# Patient Record
Sex: Male | Born: 1990 | Race: White | Hispanic: No | Marital: Single | Smoking: Current every day smoker
Health system: Southern US, Community
[De-identification: ages and names within clinical notes are randomized; demographics above are authoritative.]

## PROBLEM LIST (undated history)

## (undated) DIAGNOSIS — D573 Sickle-cell trait: Secondary | ICD-10-CM

## (undated) DIAGNOSIS — N289 Disorder of kidney and ureter, unspecified: Secondary | ICD-10-CM

---
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
## 2013-08-15 ENCOUNTER — Emergency Department (HOSPITAL_COMMUNITY)
Admission: EM | Admit: 2013-08-15 | Discharge: 2013-08-15 | Disposition: A | Discharge status: Home / Self Care | Payer: Self-pay | Attending: Emergency Medicine | Admitting: Emergency Medicine

## 2013-08-15 ENCOUNTER — Encounter (HOSPITAL_COMMUNITY): Payer: Self-pay | Admitting: Emergency Medicine

## 2013-08-15 DIAGNOSIS — K0490 Unspecified diseases of pulp and periapical tissues: Secondary | ICD-10-CM | POA: Insufficient documentation

## 2013-08-15 DIAGNOSIS — Z791 Long term (current) use of non-steroidal anti-inflammatories (NSAID): Secondary | ICD-10-CM | POA: Insufficient documentation

## 2013-08-15 DIAGNOSIS — F17200 Nicotine dependence, unspecified, uncomplicated: Secondary | ICD-10-CM | POA: Insufficient documentation

## 2013-08-15 MED ORDER — IBUPROFEN 600 MG PO TABS: 600.0000 mg | ORAL_TABLET | Freq: Four times a day (QID) | ORAL | Status: DC | PRN

## 2013-08-15 MED ORDER — HYDROCODONE-ACETAMINOPHEN 5-325 MG PO TABS: 1.0000 | ORAL_TABLET | Freq: Four times a day (QID) | ORAL | Status: DC | PRN

## 2013-08-15 MED ORDER — PENICILLIN V POTASSIUM 500 MG PO TABS: 500.0000 mg | ORAL_TABLET | Freq: Four times a day (QID) | ORAL | Status: AC

## 2013-08-15 MED ORDER — IBUPROFEN 400 MG PO TABS
600.0000 mg | ORAL_TABLET | Freq: Once | ORAL | Status: AC
  Administered 2013-08-15: 600 mg via ORAL
  Filled 2013-08-15 (×2): qty 1

## 2013-08-15 MED ORDER — HYDROCODONE-ACETAMINOPHEN 5-325 MG PO TABS
1.0000 | ORAL_TABLET | Freq: Once | ORAL | Status: AC
  Administered 2013-08-15: 1 via ORAL
  Filled 2013-08-15: qty 1

## 2013-08-15 NOTE — Discharge Instructions (Signed)
Dental Care and Dentist Visits Dental care supports good overall health. Regular dental visits can also help you avoid dental pain, bleeding, infection, and other more serious health problems in the future. It is important to keep the mouth healthy because diseases in the teeth, gums, and other oral tissues can spread to other areas of the body. Some problems, such as diabetes, heart disease, and pre-term labor have been associated with poor oral health.  See your dentist every 6 months. If you experience emergency problems such as a toothache or broken tooth, go to the dentist right away. If you see your dentist regularly, you may catch problems early. It is easier to be treated for problems in the early stages.  WHAT TO EXPECT AT A DENTIST VISIT  Your dentist will look for many common oral health problems and recommend proper treatment. At your regular dental visit, you can expect:  Gentle cleaning of the teeth and gums. This includes scraping and polishing. This helps to remove the sticky substance around the teeth and gums (plaque). Plaque forms in the mouth shortly after eating. Over time, plaque hardens on the teeth as tartar. If tartar is not removed regularly, it can cause problems. Cleaning also helps remove stains.  Periodic X-rays. These pictures of the teeth and supporting bone will help your dentist assess the health of your teeth.  Periodic fluoride treatments. Fluoride is a natural mineral shown to help strengthen teeth. Fluoride treatmentinvolves applying a fluoride gel or varnish to the teeth. It is most commonly done in children.  Examination of the mouth, tongue, jaws, teeth, and gums to look for any oral health problems, such as:  Cavities (dental caries). This is decay on the tooth caused by plaque, sugar, and acid in the mouth. It is best to catch a cavity when it is small.  Inflammation of the gums caused by plaque buildup (gingivitis).  Problems with the mouth or malformed  or misaligned teeth.  Oral cancer or other diseases of the soft tissues or jaws. KEEP YOUR TEETH AND GUMS HEALTHY For healthy teeth and gums, follow these general guidelines as well as your dentist's specific advice:  Have your teeth professionally cleaned at the dentist every 6 months.  Brush twice daily with a fluoride toothpaste.  Floss your teeth daily.  Ask your dentist if you need fluoride supplements, treatments, or fluoride toothpaste.  Eat a healthy diet. Reduce foods and drinks with added sugar.  Avoid smoking. TREATMENT FOR ORAL HEALTH PROBLEMS If you have oral health problems, treatment varies depending on the conditions present in your teeth and gums.  Your caregiver will most likely recommend good oral hygiene at each visit.  For cavities, gingivitis, or other oral health disease, your caregiver will perform a procedure to treat the problem. This is typically done at a separate appointment. Sometimes your caregiver will refer you to another dental specialist for specific tooth problems or for surgery. SEEK IMMEDIATE DENTAL CARE IF:  You have pain, bleeding, or soreness in the gum, tooth, jaw, or mouth area.  A permanent tooth becomes loose or separated from the gum socket.  You experience a blow or injury to the mouth or jaw area. Document Released: 02/25/2011 Document Revised: 09/07/2011 Document Reviewed: 02/25/2011 [employer] Patient Information 2014 [employer].  

## 2013-08-15 NOTE — ED Provider Notes (Signed)
CSN: [id number]     Arrival date & time 08/15/13  0018 History   First MD Initiated Contact with Patient 08/15/13 0031     Chief Complaint  Patient presents with  . Dental Pain     (Consider location/radiation/quality/duration/timing/severity/associated sxs/prior Treatment) HPI Comments: Pt comes in with cc of a tooth ache. Pt has hx of poor dentition, and started having left sided, tooth #17 pain yday. He took 8 aleves - and has minimal relief. Pain worse with eating. No n/v/f/c. Unsure about the cold/hot sensitivity.   Patient is a 23 y.o. male presenting with tooth pain. The history is provided by the patient.  Dental Pain Associated symptoms: no fever and no headaches     History reviewed. No pertinent past medical history. History reviewed. No pertinent past surgical history. No family history on file. History  Substance Use Topics  . Smoking status: Current Every Day Smoker    Types: Cigarettes  . Smokeless tobacco: Current User  . Alcohol Use: No    Review of Systems  Constitutional: Negative for fever and activity change.  HENT: Negative for sore throat.   Gastrointestinal: Negative for nausea and vomiting.  Neurological: Negative for headaches.      Allergies  Fish allergy and Tramadol  Home Medications   Current Outpatient Rx  Name  Route  Sig  Dispense  Refill  . acetaminophen (TYLENOL) 325 MG tablet   Oral   Take 650 mg by mouth every 6 (six) hours as needed for mild pain.         Marland Kitchen. ibuprofen (ADVIL,MOTRIN) 200 MG tablet   Oral   Take 200 mg by mouth every 6 (six) hours as needed for mild pain.         . naproxen sodium (ANAPROX) 220 MG tablet   Oral   Take 220 mg by mouth 2 (two) times daily with a meal.          BP 128/80  Pulse 84  Temp(Src) 97 F (36.1 C) (Oral)  Resp 20  SpO2 97% Physical Exam  Nursing note and vitals reviewed. Constitutional: He appears well-developed.  HENT:  Head: Normocephalic and atraumatic.  Poor  dentition around the molars and premolars. No purulence palpated. No trismus.  Cardiovascular: Normal rate.   Pulmonary/Chest: Breath sounds normal.  Lymphadenopathy:    He has no cervical adenopathy.    ED Course  Procedures (including critical care time) Labs Review Labs Reviewed - No data to display Imaging Review No results found.  EKG Interpretation   None       MDM   Final diagnoses:  None    DDx includes: - Periapical tooth infection - Dental abscess - Gingivitis - Dental trauma - Pulpitis - Nerve root compression  Pt appears to have some periapical dz. Will give antibiotics, and dental referral. No signs of deep infection.  Derwood KaplanAnkit Nautika Cressey, MD 08/15/13 [phone]

## 2013-08-15 NOTE — ED Notes (Signed)
PT reports a HX of dental pain. Pt reports he needs to have teeth pulled

## 2014-11-21 ENCOUNTER — Encounter (HOSPITAL_COMMUNITY): Payer: Self-pay | Admitting: Emergency Medicine

## 2014-11-21 ENCOUNTER — Emergency Department (HOSPITAL_COMMUNITY)
Admission: EM | Admit: 2014-11-21 | Discharge: 2014-11-21 | Disposition: A | Discharge status: Home / Self Care | Payer: Self-pay | Attending: Emergency Medicine | Admitting: Emergency Medicine

## 2014-11-21 DIAGNOSIS — Y998 Other external cause status: Secondary | ICD-10-CM | POA: Insufficient documentation

## 2014-11-21 DIAGNOSIS — T7840XA Allergy, unspecified, initial encounter: Secondary | ICD-10-CM | POA: Insufficient documentation

## 2014-11-21 DIAGNOSIS — Z87891 Personal history of nicotine dependence: Secondary | ICD-10-CM | POA: Insufficient documentation

## 2014-11-21 DIAGNOSIS — Y9289 Other specified places as the place of occurrence of the external cause: Secondary | ICD-10-CM | POA: Insufficient documentation

## 2014-11-21 DIAGNOSIS — Y9389 Activity, other specified: Secondary | ICD-10-CM | POA: Insufficient documentation

## 2014-11-21 DIAGNOSIS — X58XXXA Exposure to other specified factors, initial encounter: Secondary | ICD-10-CM | POA: Insufficient documentation

## 2014-11-21 MED ORDER — EPINEPHRINE 0.3 MG/0.3ML IJ SOAJ: 0.3000 mg | Freq: Once | INTRAMUSCULAR | Status: DC

## 2014-11-21 MED ORDER — FAMOTIDINE IN NACL 20-0.9 MG/50ML-% IV SOLN
20.0000 mg | Freq: Once | INTRAVENOUS | Status: AC
  Administered 2014-11-21: 20 mg via INTRAVENOUS
  Filled 2014-11-21: qty 50

## 2014-11-21 MED ORDER — METHYLPREDNISOLONE SODIUM SUCC 125 MG IJ SOLR
125.0000 mg | Freq: Once | INTRAMUSCULAR | Status: AC
  Administered 2014-11-21: 125 mg via INTRAVENOUS
  Filled 2014-11-21: qty 2

## 2014-11-21 MED ORDER — DIPHENHYDRAMINE HCL 50 MG/ML IJ SOLN
25.0000 mg | Freq: Once | INTRAMUSCULAR | Status: AC
  Administered 2014-11-21: 25 mg via INTRAVENOUS
  Filled 2014-11-21: qty 1

## 2014-11-21 MED ORDER — PREDNISONE 50 MG PO TABS: ORAL_TABLET | ORAL | Status: DC

## 2014-11-21 MED ORDER — SODIUM CHLORIDE 0.9 % IV BOLUS (SEPSIS)
1000.0000 mL | Freq: Once | INTRAVENOUS | Status: AC
  Administered 2014-11-21: 1000 mL via INTRAVENOUS

## 2014-11-21 NOTE — ED Notes (Signed)
Pt alert & oriented x4, stable gait. Patient given discharge instructions, paperwork & prescription(s). Patient  instructed to stop at the registration desk to finish any additional paperwork. Patient verbalized understanding. Pt left department w/ no further questions. 

## 2014-11-21 NOTE — ED Notes (Signed)
Pt states he feels like he is improving. Pt is less red now.

## 2014-11-21 NOTE — Discharge Instructions (Signed)
Prescription for prednisone and EpiPen. Recommend seeing an allergist. Can also take Benadryl.

## 2014-11-21 NOTE — ED Provider Notes (Signed)
CSN: [id number]     Arrival date & time 11/21/14  2035 History  This chart was scribed for Brady Hutching, MD by Modena Jansky, ED Scribe. This patient was seen in room APA07/[facility]07 and the patient's care was started at 8:46 PM.  Chief Complaint  Patient presents with  . Rash   The history is provided by the patient. No language interpreter was used.   HPI Comments:  Level 5 caveat for urgent need for intervention Brady Nunez is a 24 y.o. male who presents to the Emergency Department complaining of allergic reaction that occurred about 30 minutes ago. He reports that he ate some chinese food today when he started having a constant moderate itching rash, and intermittent SOB. He states that he had 4 benadryl PTA with some relief. He reports that he is feeling a little bit better now. He reports that he has a prior hx of similar allergic reactions.   History reviewed. No pertinent past medical history. History reviewed. No pertinent past surgical history. No family history on file. History  Substance Use Topics  . Smoking status: Former Smoker    Types: Cigarettes  . Smokeless tobacco: Current User  . Alcohol Use: Yes    Review of Systems A complete 10 system review of systems was obtained and all systems are negative except as noted in the HPI and PMH.   Allergies  Fish allergy and Tramadol  Home Medications   Prior to Admission medications   Medication Sig Start Date End Date Taking? Authorizing Provider  Pseudoephedrine-Ibuprofen 30-200 MG TABS Take 1-2 tablets by mouth once as needed (for cold and sinus symptoms).   Yes Historical Provider, MD  EPINEPHrine (EPIPEN 2-PAK) 0.3 mg/0.3 mL IJ SOAJ injection Inject 0.3 mLs (0.3 mg total) into the muscle once. 11/21/14   Brady Hutching, MD  HYDROcodone-acetaminophen (NORCO/VICODIN) 5-325 MG per tablet Take 1 tablet by mouth every 6 (six) hours as needed. Patient not taking: Reported on 11/21/2014 08/15/13   Derwood Kaplan, MD  predniSONE  (DELTASONE) 50 MG tablet 1 tablet for 4 days, one half tablet for 4 days 11/21/14   Brady Hutching, MD   BP 117/61 mmHg  Pulse 114  Temp(Src) 98.7 F (37.1 C) (Oral)  Resp 24  Ht  (1.702 m)  Wt 140 lb (63.504 kg)  BMI 21.92 kg/m2  SpO2 95% Physical Exam  Constitutional: He is oriented to person, place, and time. He appears well-developed and well-nourished.  HENT:  Head: Normocephalic and atraumatic.  Eyes: Conjunctivae and EOM are normal. Pupils are equal, round, and reactive to light.  Neck: Normal range of motion. Neck supple.  Cardiovascular: Normal rate and regular rhythm.   Pulmonary/Chest: Effort normal and breath sounds normal.  Abdominal: Soft. Bowel sounds are normal.  Musculoskeletal: Normal range of motion.  Neurological: He is alert and oriented to person, place, and time.  Skin: Skin is warm and dry.  Diffuse erythema and wheals   Psychiatric: He has a normal mood and affect. His behavior is normal.  Nursing note and vitals reviewed.   ED Course  Procedures (including critical care time) DIAGNOSTIC STUDIES: Oxygen Saturation is 95% on RA, normal by my interpretation.    COORDINATION OF CARE: 8:50 PM- Pt advised of plan for treatment which includes IV benadryl, IV pepcid, and IM Solu-Medrol and pt agrees.  Labs Review Labs Reviewed - No data to display  Imaging Review No results found.   EKG Interpretation None      MDM  Final diagnoses:  Allergic reaction, initial encounter   Patient presents with significant allergic reaction to [place] food. Rx IV Solumedrol, IV Benadryl, IV Pepcid. He feels much better after IV medication. Referral to allergist. Discharge medications prednisone and EpiPen.  I personally performed the services described in this documentation, which was scribed in my presence. The recorded information has been reviewed and is accurate.     Brady HutchingBrian Ayelen Sciortino, MD 11/21/14 [phone]

## 2014-11-21 NOTE — ED Notes (Signed)
   11/21/14 2044  Skin Color/Condition  Skin Color/Condition (WDL) X  Skin Color Appropriate for ethnicity  Skin Integrity Intact  pt states he is having a reaction to something. Pt says it has happened before. Pt took 3 or 4 benadryl before arriving in the ER.

## 2014-11-21 NOTE — ED Notes (Signed)
Patient is resting comfortably. 

## 2014-11-21 NOTE — ED Notes (Signed)
Patient presents with rash all over.  States was eating chinese food and began breaking out in rash; states he feels like he's wheezing.

## 2014-12-23 ENCOUNTER — Emergency Department (HOSPITAL_COMMUNITY)
Admission: EM | Admit: 2014-12-23 | Discharge: 2014-12-23 | Disposition: A | Discharge status: Home / Self Care | Payer: Self-pay | Attending: Emergency Medicine | Admitting: Emergency Medicine

## 2014-12-23 ENCOUNTER — Encounter (HOSPITAL_COMMUNITY): Payer: Self-pay

## 2014-12-23 DIAGNOSIS — Z862 Personal history of diseases of the blood and blood-forming organs and certain disorders involving the immune mechanism: Secondary | ICD-10-CM | POA: Insufficient documentation

## 2014-12-23 DIAGNOSIS — N410 Acute prostatitis: Secondary | ICD-10-CM

## 2014-12-23 DIAGNOSIS — R319 Hematuria, unspecified: Secondary | ICD-10-CM

## 2014-12-23 DIAGNOSIS — Z87448 Personal history of other diseases of urinary system: Secondary | ICD-10-CM | POA: Insufficient documentation

## 2014-12-23 DIAGNOSIS — Z720 Tobacco use: Secondary | ICD-10-CM | POA: Insufficient documentation

## 2014-12-23 HISTORY — DX: Disorder of kidney and ureter, unspecified: N28.9

## 2014-12-23 HISTORY — DX: Sickle-cell trait: D57.3

## 2014-12-23 LAB — URINALYSIS, ROUTINE W REFLEX MICROSCOPIC
Bilirubin Urine: NEGATIVE
Glucose, UA: NEGATIVE mg/dL
Ketones, ur: NEGATIVE mg/dL
Leukocytes, UA: NEGATIVE
Nitrite: NEGATIVE
PROTEIN: NEGATIVE mg/dL
Specific Gravity, Urine: 1.01 (ref 1.005–1.030)
UROBILINOGEN UA: 0.2 mg/dL (ref 0.0–1.0)
pH: 6.5 (ref 5.0–8.0)

## 2014-12-23 LAB — URINE MICROSCOPIC-ADD ON

## 2014-12-23 MED ORDER — SULFAMETHOXAZOLE-TRIMETHOPRIM 800-160 MG PO TABS
1.0000 | ORAL_TABLET | Freq: Once | ORAL | Status: AC
  Administered 2014-12-23: 1 via ORAL
  Filled 2014-12-23: qty 1

## 2014-12-23 MED ORDER — SULFAMETHOXAZOLE-TRIMETHOPRIM 800-160 MG PO TABS: 1.0000 | ORAL_TABLET | Freq: Two times a day (BID) | ORAL | Status: AC

## 2014-12-23 NOTE — ED Notes (Signed)
Pt c/o pain across lower back for the past few days and noticed blood in urine today.

## 2014-12-23 NOTE — ED Provider Notes (Signed)
CSN: [id number]     Arrival date & time 12/23/14  1559 History   First MD Initiated Contact with Patient 12/23/14 1632     Chief Complaint  Patient presents with  . Hematuria      HPI  Patient planes of back pain for the last several days and noticeable blood in his urine today. He states it when he went to urinate noticeable bit of blood at the tip of his penis. When he went to urinate states it looked like a limited mucus as well. This happened yesterday as well. He states he had to bear down and urinate yesterday and today. Mild back pain. No sudden or colicky pain no severe pain no fevers or chills nausea or vomiting. States he's only been with one sexual partner "since getting out of prison in December". His never had urethritis before.  Past Medical History  Diagnosis Date  . Renal disorder     kidney stone  . Sickle cell trait    History reviewed. No pertinent past surgical history. No family history on file. History  Substance Use Topics  . Smoking status: Current Every Day Smoker    Types: Cigarettes  . Smokeless tobacco: Current User  . Alcohol Use: Yes     Comment: occ    Review of Systems  Constitutional: Negative for fever, chills, diaphoresis, appetite change and fatigue.  HENT: Negative for mouth sores, sore throat and trouble swallowing.   Eyes: Negative for visual disturbance.  Respiratory: Negative for cough, chest tightness, shortness of breath and wheezing.   Cardiovascular: Negative for chest pain.  Gastrointestinal: Negative for nausea, vomiting, abdominal pain, diarrhea and abdominal distention.  Endocrine: Negative for polydipsia, polyphagia and polyuria.  Genitourinary: Negative for dysuria, frequency and hematuria.  Musculoskeletal: Negative for gait problem.  Skin: Negative for color change, pallor and rash.  Neurological: Negative for dizziness, syncope, light-headedness and headaches.  Hematological: Does not bruise/bleed easily.   Psychiatric/Behavioral: Negative for behavioral problems and confusion.      Allergies  Fish allergy and Tramadol  Home Medications   Prior to Admission medications   Medication Sig Start Date End Date Taking? Authorizing Provider  EPINEPHrine (EPIPEN 2-PAK) 0.3 mg/0.3 mL IJ SOAJ injection Inject 0.3 mLs (0.3 mg total) into the muscle once. 11/21/14   [name], MD  HYDROcodone-acetaminophen (NORCO/VICODIN) 5-325 MG per tablet Take 1 tablet by mouth every 6 (six) hours as needed. Patient not taking: Reported on 11/21/2014 08/15/13   Derwood Kaplan, MD  predniSONE (DELTASONE) 50 MG tablet 1 tablet for 4 days, one half tablet for 4 days 11/21/14   [name], MD  sulfamethoxazole-trimethoprim (BACTRIM DS,SEPTRA DS) 800-160 MG per tablet Take 1 tablet by mouth 2 (two) times daily. 12/23/14 01/13/15  Rolland Porter, MD   BP 108/59 mmHg  Pulse 98  Temp(Src) 98.2 F (36.8 C) (Oral)  Resp 18  Ht  (1.702 m)  Wt 140 lb (63.504 kg)  BMI 21.92 kg/m2  SpO2 97% Physical Exam  Constitutional: He is oriented to person, place, and time. He appears well-developed and well-nourished. No distress.  HENT:  Head: Normocephalic.  Eyes: Conjunctivae are normal. Pupils are equal, round, and reactive to light. No scleral icterus.  Neck: Normal range of motion. Neck supple. No thyromegaly present.  Cardiovascular: Normal rate and regular rhythm.  Exam reveals no gallop and no friction rub.   No murmur heard. Pulmonary/Chest: Effort normal and breath sounds normal. No respiratory distress. He has no wheezes. He has  no rales.  Abdominal: Soft. Bowel sounds are normal. He exhibits no distension. There is no tenderness. There is no rebound.  Genitourinary:  Normal appearance of the penis and scrotum. No discharge noted. No inguinal adenopathy. No abnormal testicular masses. Small varicocele palpable in the left.  Musculoskeletal: Normal range of motion.  Neurological: He is alert and oriented to person,  place, and time.  Skin: Skin is warm and dry. No rash noted.  Psychiatric: He has a normal mood and affect. His behavior is normal.    ED Course  Procedures (including critical care time) Labs Review Labs Reviewed  URINALYSIS, ROUTINE W REFLEX MICROSCOPIC (NOT AT [facility]) - Abnormal; Notable for the following:    APPearance HAZY (*)    Hgb urine dipstick LARGE (*)    All other components within normal limits  URINE MICROSCOPIC-ADD ON - Abnormal; Notable for the following:    Bacteria, UA FEW (*)    All other components within normal limits  GC/CHLAMYDIA PROBE AMP ([facility]) NOT AT [facility]    Imaging Review No results found.   EKG Interpretation None      MDM   Final diagnoses:  Hematuria  Acute prostatitis    Hematuria and pyuria. No bacteria. GC chlamydia pending. Culture pending. Symptoms sound most suggestive of prostatitis. Plan is Bactrim. Await culture and additional testing. Twice a day Bactrim times 21 day prescription given.    Rolland Porter, MD 12/23/14 [phone]

## 2014-12-23 NOTE — Discharge Instructions (Signed)
Your urine is also being tested for other types of infections including sexually transmitted diseases.  If any abnormalities show up on additional testing that need further treatment, you will be contacted.   Prostatitis The prostate gland is about the size and shape of a walnut. It is located just below your bladder. It produces one of the components of semen, which is made up of sperm and the fluids that help nourish and transport it out from the testicles. Prostatitis is inflammation of the prostate gland.  There are four types of prostatitis:  Acute bacterial prostatitis. This is the least common type of prostatitis. It starts quickly and usually is associated with a bladder infection, high fever, and shaking chills. It can occur at any age.  Chronic bacterial prostatitis. This is a persistent bacterial infection in the prostate. It usually develops from repeated acute bacterial prostatitis or acute bacterial prostatitis that was not properly treated. It can occur in men of any age but is most common in middle-aged men whose prostate has begun to enlarge. The symptoms are not as severe as those in acute bacterial prostatitis. Discomfort in the part of your body that is in front of your rectum and below your scrotum (perineum), lower abdomen, or in the head of your penis (glans) may represent your primary discomfort.  Chronic prostatitis (nonbacterial). This is the most common type of prostatitis. It is inflammation of the prostate gland that is not caused by a bacterial infection. The cause is unknown and may be associated with a viral infection or autoimmune disorder.  Prostatodynia (pelvic floor disorder). This is associated with increased muscular tone in the pelvis surrounding the prostate. CAUSES The causes of bacterial prostatitis are bacterial infection. The causes of the other types of prostatitis are unknown.  SYMPTOMS  Symptoms can vary depending upon the type of prostatitis that  exists. There can also be overlap in symptoms. Possible symptoms for each type of prostatitis are listed below. Acute Bacterial Prostatitis  Painful urination.  Fever or chills.  Muscle or joint pains.  Low back pain.  Low abdominal pain.  Inability to empty bladder completely. Chronic Bacterial Prostatitis, Chronic Nonbacterial Prostatitis, and Prostatodynia  Sudden urge to urinate.  Frequent urination.  Difficulty starting urine stream.  Weak urine stream.  Discharge from the urethra.  Dribbling after urination.  Rectal pain.  Pain in the testicles, penis, or tip of the penis.  Pain in the perineum.  Problems with sexual function.  Painful ejaculation.  Bloody semen. DIAGNOSIS  In order to diagnose prostatitis, your health care provider will ask about your symptoms. One or more urine samples will be taken and tested (urinalysis). If the urinalysis result is negative for bacteria, your health care provider may use a finger to feel your prostate (digital rectal exam). This exam helps your health care provider determine if your prostate is swollen and tender. It will also produce a specimen of semen that can be analyzed. TREATMENT  Treatment for prostatitis depends on the cause. If a bacterial infection is the cause, it can be treated with antibiotic medicine. In cases of chronic bacterial prostatitis, the use of antibiotics for up to 1 month or 6 weeks may be necessary. Your health care provider may instruct you to take sitz baths to help relieve pain. A sitz bath is a bath of hot water in which your hips and buttocks are under water. This relaxes the pelvic floor muscles and often helps to relieve the pressure on your prostate.  HOME CARE INSTRUCTIONS   Take all medicines as directed by your health care provider.  Take sitz baths as directed by your health care provider. SEEK MEDICAL CARE IF:   Your symptoms get worse, not better.  You have a fever. SEEK IMMEDIATE  MEDICAL CARE IF:   You have chills.  You feel nauseous or vomit.  You feel lightheaded or faint.  You are unable to urinate.  You have blood or blood clots in your urine. MAKE SURE YOU:  Understand these instructions.  Will watch your condition.  Will get help right away if you are not doing well or get worse. Document Released: 06/12/2000 Document Revised: 06/20/2013 Document Reviewed: 01/02/2013 [facility] Patient Information 2015 [place], [place]. This information is not intended to replace advice given to you by your health care provider. Make sure you discuss any questions you have with your health care provider.

## 2014-12-24 LAB — GC/CHLAMYDIA PROBE AMP (~~LOC~~) NOT AT ARMC
CHLAMYDIA, DNA PROBE: NEGATIVE
Neisseria Gonorrhea: NEGATIVE

## 2015-04-22 ENCOUNTER — Encounter (HOSPITAL_COMMUNITY): Payer: Self-pay | Admitting: *Deleted

## 2015-04-22 ENCOUNTER — Emergency Department (HOSPITAL_COMMUNITY)
Admission: EM | Admit: 2015-04-22 | Discharge: 2015-04-22 | Disposition: A | Discharge status: Home / Self Care | Payer: Self-pay | Attending: Emergency Medicine | Admitting: Emergency Medicine

## 2015-04-22 DIAGNOSIS — Z87442 Personal history of urinary calculi: Secondary | ICD-10-CM | POA: Insufficient documentation

## 2015-04-22 DIAGNOSIS — L500 Allergic urticaria: Secondary | ICD-10-CM | POA: Insufficient documentation

## 2015-04-22 DIAGNOSIS — L509 Urticaria, unspecified: Secondary | ICD-10-CM

## 2015-04-22 DIAGNOSIS — Z862 Personal history of diseases of the blood and blood-forming organs and certain disorders involving the immune mechanism: Secondary | ICD-10-CM | POA: Insufficient documentation

## 2015-04-22 DIAGNOSIS — Z720 Tobacco use: Secondary | ICD-10-CM | POA: Insufficient documentation

## 2015-04-22 MED ORDER — METHYLPREDNISOLONE SODIUM SUCC 125 MG IJ SOLR
125.0000 mg | Freq: Once | INTRAMUSCULAR | Status: DC
  Filled 2015-04-22: qty 2

## 2015-04-22 MED ORDER — FAMOTIDINE IN NACL 20-0.9 MG/50ML-% IV SOLN
20.0000 mg | Freq: Once | INTRAVENOUS | Status: DC
  Filled 2015-04-22: qty 50

## 2015-04-22 MED ORDER — DIPHENHYDRAMINE HCL 50 MG/ML IJ SOLN
50.0000 mg | Freq: Once | INTRAMUSCULAR | Status: DC
  Filled 2015-04-22: qty 1

## 2015-04-22 NOTE — ED Notes (Signed)
Pt c/o rash all over body; pt states he has had a reaction before and is unsure of what caused this time

## 2015-04-22 NOTE — Discharge Instructions (Signed)
You may take Benadryl 50 mg every 8 hours as needed.  Hives Hives are itchy, red, swollen areas of the skin. They can vary in size and location on your body. Hives can come and go for hours or several days (acute hives) or for several weeks (chronic hives). Hives do not spread from person to person (noncontagious). They may get worse with scratching, exercise, and emotional stress. CAUSES   Allergic reaction to food, additives, or drugs.  Infections, including the common cold.  Illness, such as vasculitis, lupus, or thyroid disease.  Exposure to sunlight, heat, or cold.  Exercise.  Stress.  Contact with chemicals. SYMPTOMS   Red or white swollen patches on the skin. The patches may change size, shape, and location quickly and repeatedly.  Itching.  Swelling of the hands, feet, and face. This may occur if hives develop deeper in the skin. DIAGNOSIS  Your caregiver can usually tell what is wrong by performing a physical exam. Skin or blood tests may also be done to determine the cause of your hives. In some cases, the cause cannot be determined. TREATMENT  Mild cases usually get better with medicines such as antihistamines. Severe cases may require an emergency epinephrine injection. If the cause of your hives is known, treatment includes avoiding that trigger.  HOME CARE INSTRUCTIONS   Avoid causes that trigger your hives.  Take antihistamines as directed by your caregiver to reduce the severity of your hives. Non-sedating or low-sedating antihistamines are usually recommended. Do not drive while taking an antihistamine.  Take any other medicines prescribed for itching as directed by your caregiver.  Wear loose-fitting clothing.  Keep all follow-up appointments as directed by your caregiver. SEEK MEDICAL CARE IF:   You have persistent or severe itching that is not relieved with medicine.  You have painful or swollen joints. SEEK IMMEDIATE MEDICAL CARE IF:   You have a  fever.  Your tongue or lips are swollen.  You have trouble breathing or swallowing.  You feel tightness in the throat or chest.  You have abdominal pain. These problems may be the first sign of a life-threatening allergic reaction. Call your local emergency services (911 in [place] MAKE SURE YOU:   Understand these instructions.  Will watch your condition.  Will get help right away if you are not doing well or get worse.   This information is not intended to replace advice given to you by your health care provider. Make sure you discuss any questions you have with your health care provider.   Document Released: 06/15/2005 Document Revised: 06/20/2013 Document Reviewed: 09/08/2011 Elsevier Interactive Patient Education [employer].

## 2015-04-22 NOTE — ED Notes (Signed)
Pt wanted IV out and refused meds ordered for allergic reaction. Dr. Elesa MassedWard notified and at bedside to talk with pt. Pt still does not want medications and wants IV out. States he is "all better now" and that this is what happens to him "everytime". IV removed per pt request.

## 2015-04-22 NOTE — ED Provider Notes (Signed)
TIME SEEN: 1:05 AM  CHIEF COMPLAINT: rash  HPI:  HPI Comments: Brady Nunez is a 24 y.o. Male with a hx of previous kidney stones, multiple prior episodes of urticaria without known cause who presents to the Emergency Department complaining of itching, red, waxing and waning, generalized rash onset 1 hour ago. He states that he has a hx of similar symptoms, several times in the past year, but does not know the cause. He states that sometimes he can take Benadryl to relief but has not taken any today. He denies new foods, soaps, detergents, lotions, or medications. He denies fever, chills, SOB, nausea, vomiting.  No difficulty speaking, breathing, swallowing. No lip or tongue swelling. He has not seen a dermatologist for this problem. He denies taking daily medications. No tick bite. No recent camping, hiking. No lesions on his palms, soles are mucous membranes. Describes the rash is pruritic.  ROS: See HPI Constitutional: no fever  Eyes: no drainage  ENT: no runny nose   Cardiovascular:  no chest pain  Resp: no SOB  GI: no vomiting GU: no dysuria Integumentary: rash  Allergy: hives  Musculoskeletal: no leg swelling  Neurological: no slurred speech ROS otherwise negative  PAST MEDICAL HISTORY/PAST SURGICAL HISTORY:  Past Medical History  Diagnosis Date  . Renal disorder     kidney stone  . Sickle cell trait (HCC)     MEDICATIONS:  Prior to Admission medications   Not on File    ALLERGIES:  Allergies  Allergen Reactions  . Tramadol Hives    SOCIAL HISTORY:  Social History  Substance Use Topics  . Smoking status: Current Every Day Smoker    Types: Cigarettes  . Smokeless tobacco: Current User  . Alcohol Use: Yes     Comment: occ    FAMILY HISTORY: History reviewed. No pertinent family history.  EXAM: BP 121/99 mmHg  Pulse 108  Temp(Src) 97.7 F (36.5 C) (Oral)  Resp 20  Ht  (1.702 m)  Wt 135 lb (61.236 kg)  BMI 21.14 kg/m2  CONSTITUTIONAL: Alert and  oriented and responds appropriately to questions. Well-appearing; well-nourished HEAD: Normocephalic EYES: Conjunctivae clear, PERRL ENT: normal nose; no rhinorrhea; moist mucous membranes; pharynx without lesions noted, no tonsillar hypertrophy or exudate, no uvular deviation, no trismus or drooling, no stridor, normal phonation, no angioedema, no lesions involving the mucous membranes NECK: Supple, no meningismus, no LAD  CARD: Regular and minimally tachycardic; S1 and S2 appreciated; no murmurs, no clicks, no rubs, no gallops RESP: Normal chest excursion without splinting or tachypnea; breath sounds clear and equal bilaterally; no wheezes, no rhonchi, no rales, no hypoxia or respiratory distress, speaking full sentences ABD/GI: Normal bowel sounds; non-distended; soft, non-tender, no rebound, no guarding, no peritoneal signs BACK:  The back appears normal and is non-tender to palpation, there is no CVA tenderness EXT: Normal ROM in all joints; non-tender to palpation; no edema; normal capillary refill; no cyanosis, no calf tenderness or swelling    SKIN: Patient's skin is erythematous with diffuse scattered urticaria to his extremities, torso and face, no petechia or purpura, no desquamation or blisters, no vesicular lesions, no bull's-eye rash or target lesions, no rash on his palms, soles are mucous membranes NEURO: Moves all extremities equally, sensation to light touch intact diffusely, cranial nerves II through XII intact PSYCH: The patient's mood and manner are appropriate. Grooming and personal hygiene are appropriate.  MEDICAL DECISION MAKING: Patient here with urticaria of unknown cause. Has had similar symptoms in  the past. No angioedema, hypoxia, hypotension, respiratory distress. No involvement of the palms, soles or mucous membranes. Will treat with Benadryl, Solu-Medrol and Pepcid. I do not feel at this time he needs epinephrine. We'll monitor him closely.  ED PROGRESS:  1:08  AM-Discussed treatment plan which includes seeing an allergist as an outpatient and IV indications with pt at bedside and pt agreed to plan.   1:40 AM  Pt's urticaria have spontaneously resolved prior to receiving any medication. He states he would like to go home and take Benadryl home as needed. He reports is his happened to him in the past for his rash will resolve either on its own or with Benadryl. He states the reason he came to the emergency department was cut as he did not have been drawn home. Have recommended close outpatient follow-up with his PCP who may refer him to an allergy specialist. Discussed return precautions. Given symptoms have resolved without intervention I do not feel he needs to be on steroids. He verbalizes understanding and is comfortable with this plan.     I personally performed the services described in this documentation, which was scribed in my presence. The recorded information has been reviewed and is accurate.     Layla MawKristen N Doris Gruhn, DO 04/22/15 [phone]

## 2015-05-13 ENCOUNTER — Emergency Department (HOSPITAL_COMMUNITY)
Admission: EM | Admit: 2015-05-13 | Discharge: 2015-05-13 | Disposition: A | Discharge status: Home / Self Care | Payer: Self-pay | Attending: Emergency Medicine | Admitting: Emergency Medicine

## 2015-05-13 ENCOUNTER — Encounter (HOSPITAL_COMMUNITY): Payer: Self-pay | Admitting: *Deleted

## 2015-05-13 DIAGNOSIS — F17210 Nicotine dependence, cigarettes, uncomplicated: Secondary | ICD-10-CM | POA: Insufficient documentation

## 2015-05-13 DIAGNOSIS — Z862 Personal history of diseases of the blood and blood-forming organs and certain disorders involving the immune mechanism: Secondary | ICD-10-CM | POA: Insufficient documentation

## 2015-05-13 DIAGNOSIS — L509 Urticaria, unspecified: Secondary | ICD-10-CM | POA: Insufficient documentation

## 2015-05-13 DIAGNOSIS — Z87442 Personal history of urinary calculi: Secondary | ICD-10-CM | POA: Insufficient documentation

## 2015-05-13 DIAGNOSIS — R0600 Dyspnea, unspecified: Secondary | ICD-10-CM | POA: Insufficient documentation

## 2015-05-13 MED ORDER — FAMOTIDINE 20 MG PO TABS: 20.0000 mg | ORAL_TABLET | Freq: Two times a day (BID) | ORAL | Status: AC

## 2015-05-13 MED ORDER — FAMOTIDINE IN NACL 20-0.9 MG/50ML-% IV SOLN
20.0000 mg | Freq: Once | INTRAVENOUS | Status: AC
  Administered 2015-05-13: 20 mg via INTRAVENOUS
  Filled 2015-05-13: qty 50

## 2015-05-13 MED ORDER — DIPHENHYDRAMINE HCL 25 MG PO TABS: 50.0000 mg | ORAL_TABLET | Freq: Four times a day (QID) | ORAL | Status: AC | PRN

## 2015-05-13 MED ORDER — SODIUM CHLORIDE 0.9 % IV BOLUS (SEPSIS)
1000.0000 mL | Freq: Once | INTRAVENOUS | Status: AC
  Administered 2015-05-13: 1000 mL via INTRAVENOUS

## 2015-05-13 MED ORDER — METHYLPREDNISOLONE SODIUM SUCC 125 MG IJ SOLR
125.0000 mg | Freq: Once | INTRAMUSCULAR | Status: AC
  Administered 2015-05-13: 125 mg via INTRAVENOUS
  Filled 2015-05-13: qty 2

## 2015-05-13 MED ORDER — KETOROLAC TROMETHAMINE 30 MG/ML IJ SOLN
30.0000 mg | Freq: Once | INTRAMUSCULAR | Status: AC
  Administered 2015-05-13: 30 mg via INTRAVENOUS
  Filled 2015-05-13: qty 1

## 2015-05-13 MED ORDER — DIPHENHYDRAMINE HCL 50 MG/ML IJ SOLN
50.0000 mg | Freq: Once | INTRAMUSCULAR | Status: AC
  Administered 2015-05-13: 50 mg via INTRAVENOUS
  Filled 2015-05-13: qty 1

## 2015-05-13 MED ORDER — PREDNISONE 20 MG PO TABS: 60.0000 mg | ORAL_TABLET | Freq: Every day | ORAL | Status: DC

## 2015-05-13 NOTE — ED Notes (Signed)
Pt states he woke w/ a rash, has happened several time in the past. Pt denies any new clothes, foods, medications or detergents. Pt no problem w/ speaking, shows no respiratory distress. Pt states trying hard not to scratch.

## 2015-05-13 NOTE — ED Notes (Addendum)
Pt resting calmly w/ eyes closed. Rise & fall of the chest noted. Bed in low position, side rails up x2. NAD noted at this time.  

## 2015-05-13 NOTE — ED Notes (Signed)
Pt states he woke up from sleep with itching to his face and trouble breathing; pt states the rash is all over his body; pt has red raised bumps all over body

## 2015-05-13 NOTE — ED Provider Notes (Signed)
TIME SEEN: 2:30 AM  CHIEF COMPLAINT: Urticaria  HPI: Pt is a 24 y.o. male with history of kidney stones and multiple prior episodes of urticaria without a known cause who presents emergency department tonight with itching, red, raised rash that started 45 minutes prior to arrival. Has had this many times in the past. Normally he is able to take Benadryl at home but could not find any in his house. Denies any new foods, soaps, lotions, detergents, medications. No fevers. States he feels like his skin is painful when it is tight. Feels like he is having a hard time breathing but no wheezing. No difficulty speaking or swallowing. No lip or tongue swelling. He has not yet seen a dermatologist or primary care physician for this.  ROS: See HPI Constitutional: no fever  Eyes: no drainage  ENT: no runny nose   Cardiovascular:  no chest pain  Resp: no SOB  GI: no vomiting GU: no dysuria Integumentary: no rash  Allergy:  hives  Musculoskeletal: no leg swelling  Neurological: no slurred speech ROS otherwise negative  PAST MEDICAL HISTORY/PAST SURGICAL HISTORY:  Past Medical History  Diagnosis Date  . Renal disorder     kidney stone  . Sickle cell trait (HCC)     MEDICATIONS:  Prior to Admission medications   Not on File    ALLERGIES:  Allergies  Allergen Reactions  . Tramadol Hives    SOCIAL HISTORY:  Social History  Substance Use Topics  . Smoking status: Current Every Day Smoker    Types: Cigarettes  . Smokeless tobacco: Current User  . Alcohol Use: Yes     Comment: occ    FAMILY HISTORY: No family history on file.  EXAM: BP 110/76 mmHg  Pulse 111  Temp(Src) 97.7 F (36.5 C) (Oral)  Resp 22  Ht 5\' 7"  (1.702 m)  Wt 145 lb (65.772 kg)  BMI 22.71 kg/m2  SpO2 95% CONSTITUTIONAL: Alert and oriented and responds appropriately to questions. Well-appearing; well-nourished HEAD: Normocephalic EYES: Conjunctivae clear, PERRL ENT: normal nose; no rhinorrhea; moist mucous  membranes; pharynx without lesions noted, no angioedema, normal phonation, no stridor, no drooling, able to swallow his and secretions without any difficulty NECK: Supple, no meningismus, no LAD  CARD: Regular and tachycardic; S1 and S2 appreciated; no murmurs, no clicks, no rubs, no gallops RESP: Normal chest excursion without splinting or tachypnea; breath sounds clear and equal bilaterally; no wheezes, no rhonchi, no rales, no hypoxia or respiratory distress, speaking full sentences ABD/GI: Normal bowel sounds; non-distended; soft, non-tender, no rebound, no guarding, no peritoneal signs BACK:  The back appears normal and is non-tender to palpation, there is no CVA tenderness EXT: Normal ROM in all joints; non-tender to palpation; no edema; normal capillary refill; no cyanosis, no calf tenderness or swelling    SKIN: Normal color for age and race; warm, diffuse urticaria, no rash involving the palms or soles or mucous membranes, no petechiae or purpura, no desquamation or blisters, no vesicular lesions NEURO: Moves all extremities equally, sensation to light touch intact diffusely, cranial nerves II through XII intact PSYCH: The patient's mood and manner are appropriate. Grooming and personal hygiene are appropriate.  MEDICAL DECISION MAKING: Patient here with diffuse urticaria. Reports he is having difficulty breathing this oxygen saturation is normal in his lungs are completely clear with good aeration. No angioedema. No changes in his voice. He is swallowing his secretions without difficulty. We'll give IV Benadryl, Solu-Medrol, Pepcid and monitor patient. He reports that he  feels like his skin is very painful to touch. We'll give him Toradol for pain control.  ED PROGRESS: 3:40 AM  Pt's hives have completely resolved. He reports feeling much better sleeping comfortably. Reports he can breathe normally. Pain is also gone. He states he feels very tired after receiving IV Benadryl and does not have  anyone to pick him up. We'll allow patient to sleep until he is more awake and can safely drive home. We'll discharge with prescription for steroids, Pepcid. Have advised him to get Benadryl to have at home. Again recommended outpatient follow-up. Discussed return precautions. He verbalizes understanding and is comfortable with this plan.     Layla Maw Shaquil Aldana, DO 05/13/15 [phone]

## 2015-05-13 NOTE — Discharge Instructions (Signed)
Hives Hives are itchy, red, swollen areas of the skin. They can vary in size and location on your body. Hives can come and go for hours or several days (acute hives) or for several weeks (chronic hives). Hives do not spread from person to person (noncontagious). They may get worse with scratching, exercise, and emotional stress. CAUSES   Allergic reaction to food, additives, or drugs.  Infections, including the common cold.  Illness, such as vasculitis, lupus, or thyroid disease.  Exposure to sunlight, heat, or cold.  Exercise.  Stress.  Contact with chemicals. SYMPTOMS   Red or white swollen patches on the skin. The patches may change size, shape, and location quickly and repeatedly.  Itching.  Swelling of the hands, feet, and face. This may occur if hives develop deeper in the skin. DIAGNOSIS  Your caregiver can usually tell what is wrong by performing a physical exam. Skin or blood tests may also be done to determine the cause of your hives. In some cases, the cause cannot be determined. TREATMENT  Mild cases usually get better with medicines such as antihistamines. Severe cases may require an emergency epinephrine injection. If the cause of your hives is known, treatment includes avoiding that trigger.  HOME CARE INSTRUCTIONS   Avoid causes that trigger your hives.  Take antihistamines as directed by your caregiver to reduce the severity of your hives. Non-sedating or low-sedating antihistamines are usually recommended. Do not drive while taking an antihistamine.  Take any other medicines prescribed for itching as directed by your caregiver.  Wear loose-fitting clothing.  Keep all follow-up appointments as directed by your caregiver. SEEK MEDICAL CARE IF:   You have persistent or severe itching that is not relieved with medicine.  You have painful or swollen joints. SEEK IMMEDIATE MEDICAL CARE IF:   You have a fever.  Your tongue or lips are swollen.  You have  trouble breathing or swallowing.  You feel tightness in the throat or chest.  You have abdominal pain. These problems may be the first sign of a life-threatening allergic reaction. Call your local emergency services (911 in [place] MAKE SURE YOU:   Understand these instructions.  Will watch your condition.  Will get help right away if you are not doing well or get worse.   This information is not intended to replace advice given to you by your health care provider. Make sure you discuss any questions you have with your health care provider.   Document Released: 06/15/2005 Document Revised: 06/20/2013 Document Reviewed: 09/08/2011 Elsevier Interactive Patient Education 2016 [employer]  

## 2015-05-13 NOTE — ED Notes (Signed)
Pt alert & oriented x4, stable gait. Patient given discharge instructions, paperwork & prescription(s). Patient  instructed to stop at the registration desk to finish any additional paperwork. Patient verbalized understanding. Pt left department w/ no further questions. 

## 2015-07-25 ENCOUNTER — Encounter (HOSPITAL_COMMUNITY): Payer: Self-pay | Admitting: Emergency Medicine

## 2015-07-25 ENCOUNTER — Emergency Department (HOSPITAL_COMMUNITY)
Admission: EM | Admit: 2015-07-25 | Discharge: 2015-07-26 | Discharge status: Against Medical Advice | Payer: Self-pay | Attending: Emergency Medicine | Admitting: Emergency Medicine

## 2015-07-25 DIAGNOSIS — T7840XA Allergy, unspecified, initial encounter: Secondary | ICD-10-CM | POA: Insufficient documentation

## 2015-07-25 DIAGNOSIS — Y9389 Activity, other specified: Secondary | ICD-10-CM | POA: Insufficient documentation

## 2015-07-25 DIAGNOSIS — Y9289 Other specified places as the place of occurrence of the external cause: Secondary | ICD-10-CM | POA: Insufficient documentation

## 2015-07-25 DIAGNOSIS — X58XXXA Exposure to other specified factors, initial encounter: Secondary | ICD-10-CM | POA: Insufficient documentation

## 2015-07-25 DIAGNOSIS — Y998 Other external cause status: Secondary | ICD-10-CM | POA: Insufficient documentation

## 2015-07-25 NOTE — ED Notes (Signed)
Pt c/o allergic reaction with rash to bilateral arms and trunk of body.

## 2015-07-26 NOTE — ED Notes (Signed)
Unable to locate patient at this time. Room is empty and lights are turned off.

## 2015-07-26 NOTE — ED Notes (Signed)
Unable to locate patient at this time. Room is empty.

## 2016-03-16 ENCOUNTER — Encounter (HOSPITAL_COMMUNITY): Payer: Self-pay | Admitting: Emergency Medicine

## 2016-03-16 ENCOUNTER — Emergency Department (HOSPITAL_COMMUNITY)
Admission: EM | Admit: 2016-03-16 | Discharge: 2016-03-16 | Disposition: A | Discharge status: Home / Self Care | Payer: Self-pay | Attending: Emergency Medicine | Admitting: Emergency Medicine

## 2016-03-16 ENCOUNTER — Emergency Department (HOSPITAL_COMMUNITY): Payer: Self-pay

## 2016-03-16 DIAGNOSIS — M791 Myalgia: Secondary | ICD-10-CM | POA: Insufficient documentation

## 2016-03-16 DIAGNOSIS — J209 Acute bronchitis, unspecified: Secondary | ICD-10-CM | POA: Insufficient documentation

## 2016-03-16 DIAGNOSIS — R51 Headache: Secondary | ICD-10-CM | POA: Insufficient documentation

## 2016-03-16 DIAGNOSIS — Z79899 Other long term (current) drug therapy: Secondary | ICD-10-CM | POA: Insufficient documentation

## 2016-03-16 DIAGNOSIS — Z792 Long term (current) use of antibiotics: Secondary | ICD-10-CM | POA: Insufficient documentation

## 2016-03-16 DIAGNOSIS — F17210 Nicotine dependence, cigarettes, uncomplicated: Secondary | ICD-10-CM | POA: Insufficient documentation

## 2016-03-16 MED ORDER — AMOXICILLIN 500 MG PO CAPS: 500.0000 mg | ORAL_CAPSULE | Freq: Three times a day (TID) | ORAL | 0 refills | Status: AC

## 2016-03-16 MED ORDER — BENZONATATE 100 MG PO CAPS: 200.0000 mg | ORAL_CAPSULE | Freq: Three times a day (TID) | ORAL | 0 refills | Status: AC | PRN

## 2016-03-16 NOTE — ED Triage Notes (Signed)
Pt c/o productive cough, HA, and general malaise x 1 day.

## 2016-03-16 NOTE — ED Provider Notes (Signed)
[facility]-EMERGENCY DEPT Provider Note   CSN: [id number] Arrival date & time: 03/16/16  1050  By signing my name below, I, Soijett Blue, attest that this documentation has been prepared under the direction and in the presence of Burgess Amor, PA-C Electronically Signed: Soijett Blue, ED Scribe. 03/16/16. 12:06 PM.   History   Chief Complaint Chief Complaint  Patient presents with  . Cough    HPI Brady Nunez is a 25 y.o. male with a PMHx of sickle cell trait, who presents to the Emergency Department complaining of productive cough x dark green/brown/black sputum onset 2 days ago worsening yesterday. Pt notes that he has a burning sensation every time he coughs. He states that he is having associated symptoms of nasal congestion, post-nasal drip, sore throat due to cough, chills, and resolved wheezing x last night. He states that he has not tried any medications for the relief of his symptoms. He denies fever, SOB, and any other symptoms. Pt states that he has a 11 year cigarette smoking hx and he smokes up to 1-2 PPD. Pt notes that he has quit smoking in 2015 for 9 months and then he spontaneously picked it back up following cessation. Denies having a PCP.    The history is provided by the patient. No language interpreter was used.    Past Medical History:  Diagnosis Date  . Renal disorder    kidney stone  . Sickle cell trait (HCC)     There are no active problems to display for this patient.   History reviewed. No pertinent surgical history.     Home Medications    Prior to Admission medications   Medication Sig Start Date End Date Taking? Authorizing Provider  amoxicillin (AMOXIL) 500 MG capsule Take 1 capsule (500 mg total) by mouth 3 (three) times daily. 03/16/16 03/26/16  Burgess Amor, PA-C  benzonatate (TESSALON) 100 MG capsule Take 2 capsules (200 mg total) by mouth 3 (three) times daily as needed. 03/16/16   Burgess Amor, PA-C  diphenhydrAMINE (BENADRYL) 25 MG tablet Take 2  tablets (50 mg total) by mouth every 6 (six) hours as needed. 05/13/15   Kristen N Ward, DO  famotidine (PEPCID) 20 MG tablet Take 1 tablet (20 mg total) by mouth 2 (two) times daily. Patient not taking: Reported on 07/25/2015 05/13/15   Kristen N Ward, DO  predniSONE (DELTASONE) 20 MG tablet Take 3 tablets (60 mg total) by mouth daily. Patient not taking: Reported on 07/25/2015 05/13/15   Layla Maw Ward, DO    Family History No family history on file.  Social History Social History  Substance Use Topics  . Smoking status: Current Every Day Smoker    Packs/day: 1.00    Types: Cigarettes  . Smokeless tobacco: Current User  . Alcohol use Yes     Comment: occ     Allergies   Tramadol   Review of Systems Review of Systems  Constitutional: Positive for chills. Negative for fever.  HENT: Positive for postnasal drip and sore throat (due to cough).   Respiratory: Positive for cough (productive, green/brown/black sputum) and wheezing (resolved). Negative for shortness of breath.   Musculoskeletal: Positive for myalgias.  Neurological: Positive for headaches.     Physical Exam Updated Vital Signs BP 126/77 (BP Location: Right Arm)   Pulse 100   Temp 98.7 F (37.1 C) (Oral)   Resp 16   Ht 5\' 7"  (1.702 m)   Wt 65.8 kg   SpO2 100%   BMI  22.71 kg/m   Physical Exam  Constitutional: He appears well-developed and well-nourished.  HENT:  Head: Normocephalic and atraumatic.  Mouth/Throat: Uvula is midline, oropharynx is clear and moist and mucous membranes are normal.  Eyes: Conjunctivae are normal.  Neck: Normal range of motion.  Cardiovascular: Normal rate, regular rhythm, normal heart sounds and intact distal pulses.  Exam reveals no gallop and no friction rub.   No murmur heard. Pulmonary/Chest: Effort normal and breath sounds normal. No respiratory distress. He has no wheezes. He has no rales.  Abdominal: He exhibits no distension.  Musculoskeletal: Normal range of motion.   Neurological: He is alert.  Skin: Skin is warm and dry.  Psychiatric: He has a normal mood and affect.  Nursing note and vitals reviewed.    ED Treatments / Results  DIAGNOSTIC STUDIES: Oxygen Saturation is 95% on RA, adequate by my interpretation.    COORDINATION OF CARE: 12:06 PM Discussed treatment plan with pt at bedside which includes CXR, abx Rx, and pt agreed to plan.    Radiology Dg Chest 2 View  Result Date: 03/16/2016 CLINICAL DATA:  Productive cough with headache and malaise for 1 day. EXAM: CHEST  2 VIEW COMPARISON:  07/26/2009 and 02/24/2010 FINDINGS: The heart size and mediastinal contours are normal. The lungs are clear. There is no pleural effusion or pneumothorax. No acute osseous findings are identified. IMPRESSION: Stable chest.  No active cardiopulmonary process. Electronically Signed   By: Carey BullocksWilliam  Veazey M.D.   On: 03/16/2016 11:38    Procedures Procedures (including critical care time)  Medications Ordered in ED Medications - No data to display   Initial Impression / Assessment and Plan / ED Course  I have reviewed the triage vital signs and the nursing notes.  Pertinent imaging results that were available during my care of the patient were reviewed by me and considered in my medical decision making (see chart for details).  Clinical Course    Imaging reviewed and discussed with pt.  Time spent with smoking cessation/hazards.  He was placed on amoxil, tessalon.  Encouraged f/u prn.  Referral given for pcp.  Final Clinical Impressions(s) / ED Diagnoses   Final diagnoses:  Acute bronchitis, unspecified organism    New Prescriptions Discharge Medication List as of 03/16/2016 12:11 PM    START taking these medications   Details  amoxicillin (AMOXIL) 500 MG capsule Take 1 capsule (500 mg total) by mouth 3 (three) times daily., Starting Mon 03/16/2016, Until Thu 03/26/2016, Print    benzonatate (TESSALON) 100 MG capsule Take 2 capsules (200 mg  total) by mouth 3 (three) times daily as needed., Starting Mon 03/16/2016, Print        I personally performed the services described in this documentation, which was scribed in my presence. The recorded information has been reviewed and is accurate.    Burgess AmorJulie York Valliant, PA-C 03/16/16 1216    Loren Raceravid Yelverton, MD 03/17/16 1451

## 2016-03-16 NOTE — ED Notes (Signed)
Pt to Radiology

## 2016-03-16 NOTE — ED Notes (Signed)
Pt back to room.

## 2017-12-24 IMAGING — DX DG CHEST 2V
2 series · 2 of 2 positions shown · non-contrast
Comparison: 07/26/2009 and 02/24/2010

CLINICAL DATA: Productive cough with headache and malaise for 1
day.

EXAM:
CHEST  2 VIEW

[chest pa]
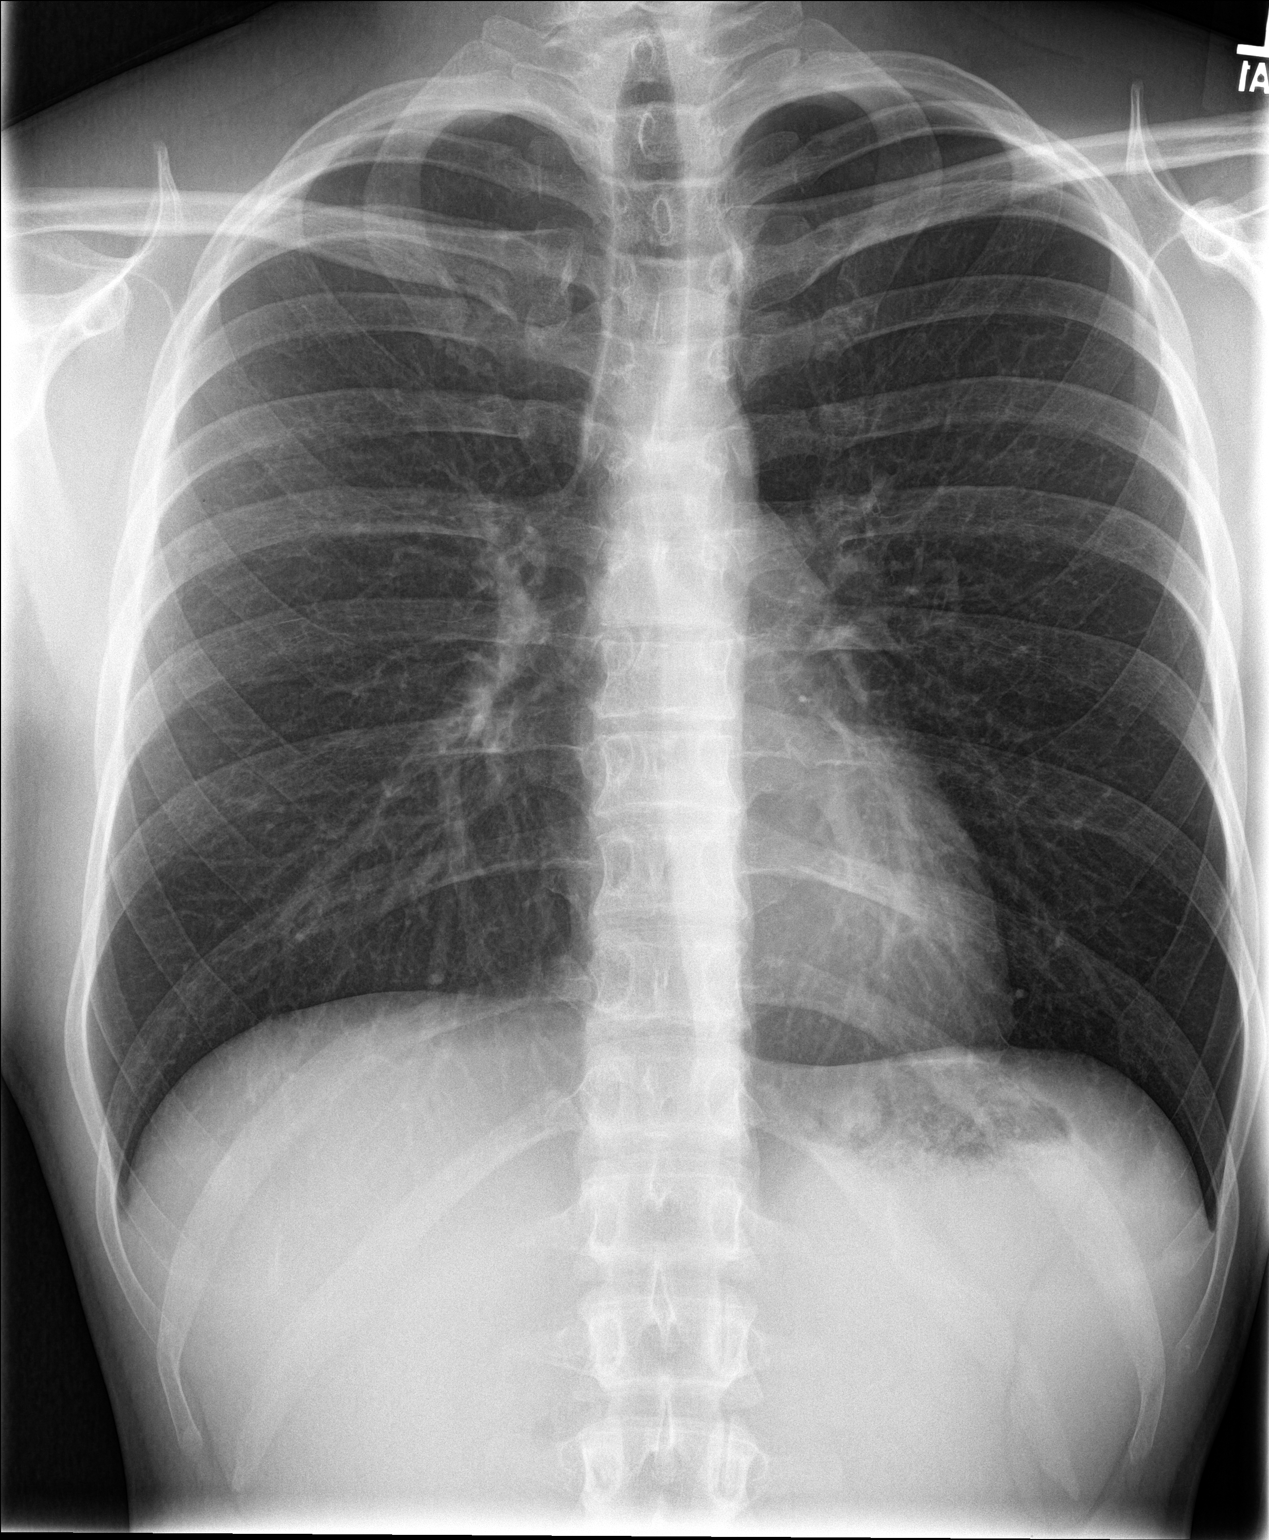

[chest lat]
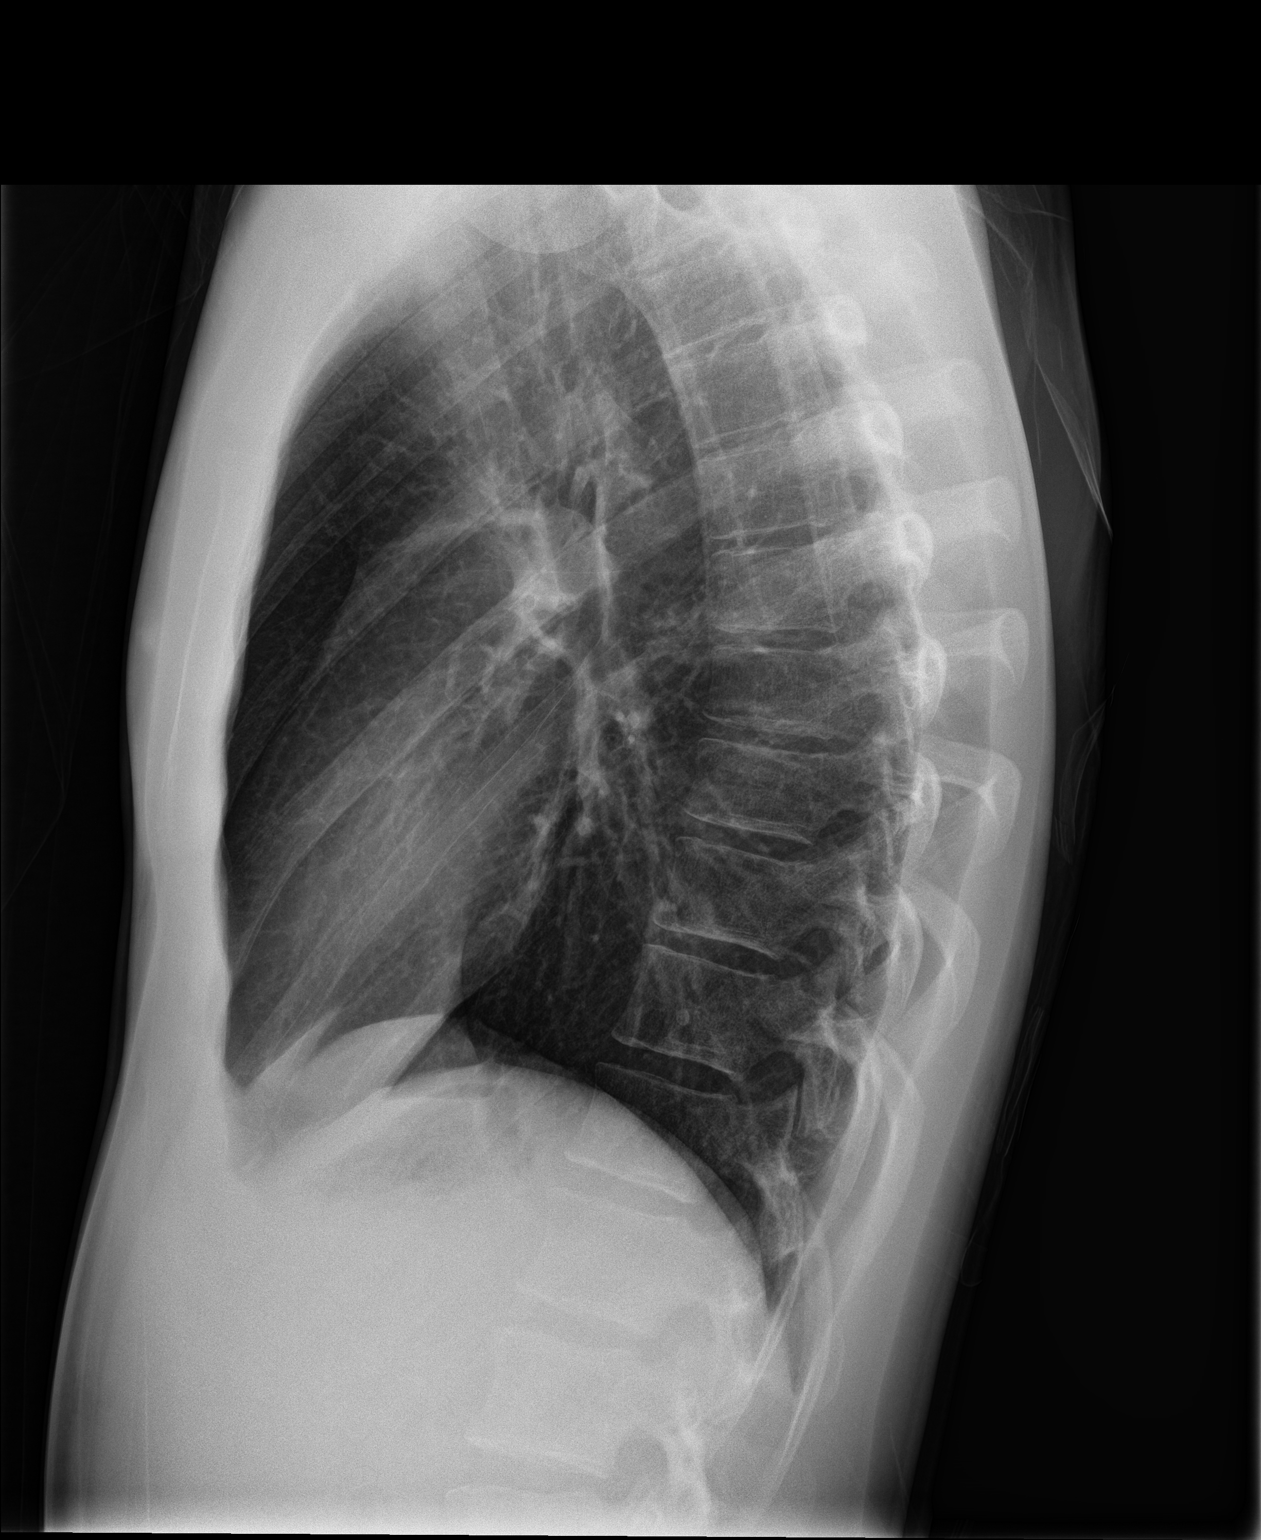

[2 of 2 positions shown; findings below may reference images not displayed]

FINDINGS: The heart size and mediastinal contours are normal. The lungs are
clear. There is no pleural effusion or pneumothorax. No acute
osseous findings are identified.
IMPRESSION: Stable chest.  No active cardiopulmonary process.

## 2021-09-05 ENCOUNTER — Encounter (HOSPITAL_COMMUNITY): Payer: Self-pay | Admitting: Emergency Medicine

## 2021-09-05 ENCOUNTER — Emergency Department (HOSPITAL_COMMUNITY)
Admission: EM | Admit: 2021-09-05 | Discharge: 2021-09-05 | Disposition: A | Discharge status: Home / Self Care | Payer: Self-pay | Attending: Emergency Medicine | Admitting: Emergency Medicine

## 2021-09-05 ENCOUNTER — Other Ambulatory Visit: Payer: Self-pay

## 2021-09-05 DIAGNOSIS — B349 Viral infection, unspecified: Secondary | ICD-10-CM | POA: Insufficient documentation

## 2021-09-05 DIAGNOSIS — Z20822 Contact with and (suspected) exposure to covid-19: Secondary | ICD-10-CM | POA: Insufficient documentation

## 2021-09-05 DIAGNOSIS — F17210 Nicotine dependence, cigarettes, uncomplicated: Secondary | ICD-10-CM | POA: Insufficient documentation

## 2021-09-05 LAB — RESP PANEL BY RT-PCR (FLU A&B, COVID) ARPGX2
Influenza A by PCR: NEGATIVE
Influenza B by PCR: NEGATIVE
SARS Coronavirus 2 by RT PCR: NEGATIVE

## 2021-09-05 NOTE — ED Triage Notes (Signed)
Pt c/o sore throat and hoarse for the past few hours.  ?

## 2021-09-05 NOTE — ED Provider Notes (Signed)
?[facility]-EMERGENCY DEPT ?[facility] Emergency Department ?Provider Note ?MRN:  [id number]  ?Arrival date & time: 09/05/21    ? ?Chief Complaint   ?Sore Throat ?  ?History of Present Illness   ?Brady Nunez is a 31 y.o. year-old male with no pertinent past medical presenting to the ED with chief complaint of sore throat. ? ?Sore throat and hoarse voice over the past 2 hours.  Subjective fever, cough as well. ? ?Review of Systems  ?A thorough review of systems was obtained and all systems are negative except as noted in the HPI and PMH.  ? ?Patient's Health History   ? ?Past Medical History:  ?Diagnosis Date  ? Renal disorder   ? kidney stone  ? Sickle cell trait (HCC)   ?  ?History reviewed. No pertinent surgical history.  ?History reviewed. No pertinent family history.  ?Social History  ? ?Socioeconomic History  ? Marital status: Single  ?  Spouse name: Not on file  ? Number of children: Not on file  ? Years of education: Not on file  ? Highest education level: Not on file  ?Occupational History  ? Not on file  ?Tobacco Use  ? Smoking status: Every Day  ?  Packs/day: 1.00  ?  Types: Cigarettes  ? Smokeless tobacco: Current  ?Substance and Sexual Activity  ? Alcohol use: Yes  ?  Comment: occ  ? Drug use: No  ? Sexual activity: Not on file  ?Other Topics Concern  ? Not on file  ?Social History Narrative  ? Not on file  ? ?Social Determinants of Health  ? ?Financial Resource Strain: Not on file  ?Food Insecurity: Not on file  ?Transportation Needs: Not on file  ?Physical Activity: Not on file  ?Stress: Not on file  ?Social Connections: Not on file  ?Intimate Partner Violence: Not on file  ?  ? ?Physical Exam  ? ?Vitals:  ? 09/05/21 0408  ?BP: 126/80  ?Pulse: 94  ?Resp: 16  ?Temp: 98.3 ?F (36.8 ?C)  ?SpO2: 96%  ?  ?CONSTITUTIONAL: Well-appearing, NAD ?NEURO/PSYCH:  Alert and oriented x 3, no focal deficits ?EYES:  eyes equal and reactive ?ENT/NECK:  no LAD, no JVD ?CARDIO: Regular rate, well-perfused, normal S1 and  S2 ?PULM:  CTAB no wheezing or rhonchi ?GI/GU:  non-distended, non-tender ?MSK/SPINE:  No gross deformities, no edema ?SKIN:  no rash, atraumatic ? ? ?*Additional and/or pertinent findings included in MDM below ? ?Diagnostic and Interventional Summary  ? ? EKG Interpretation ? ?Date/Time:    ?Ventricular Rate:    ?PR Interval:    ?QRS Duration:   ?QT Interval:    ?QTC Calculation:   ?R Axis:     ?Text Interpretation:   ?  ? ?  ? ?Labs Reviewed  ?RESP PANEL BY RT-PCR (FLU A&B, COVID) [id number]  ?  ?No orders to display  ?  ?Medications - No data to display  ? ?Procedures  /  Critical Care ?Procedures ? ?ED Course and Medical Decision Making  ?Initial Impression and Ddx ?Suspect laryngitis in the setting of viral illness.  Posterior oropharynx is mildly erythematous but there is no evidence of abscess or significant contiguous infection.  Doubt emergent process, appropriate for discharge. ? ?Past medical/surgical history that increases complexity of ED encounter: None ? ?Interpretation of Diagnostics ?Not applicable ? ?Patient Reassessment and Ultimate Disposition/Management ?Discharge home ? ?Patient management required discussion with the following services or consulting groups:  None ? ?Complexity of Problems Addressed ?Acute complicated illness  or Injury ? ?Additional Data Reviewed and Analyzed ?Further history obtained from: ?None ? ?Additional Factors Impacting ED Encounter Risk ?None ? ?Elmer Sow. Pilar Plate, MD ?[facility] Emergency Medicine ?[facility] [email or web address] ? ?Final Clinical Impressions(s) / ED Diagnoses  ? ?  ICD-10-CM   ?1. Viral illness  B34.9   ?  ?  ?ED Discharge Orders   ? ? None  ? ?  ?  ? ?Discharge Instructions Discussed with and Provided to Patient:  ? ? ?Discharge Instructions   ? ?  ?You were evaluated in the Emergency Department and after careful evaluation, we did not find any emergent condition requiring admission or further testing in the hospital. ? ?Your exam/testing  today was overall reassuring.  Consistent with viral illness.  Follow-up on your COVID and flu swab results. ? ?Please return to the Emergency Department if you experience any worsening of your condition.  Thank you for allowing [place] to be a part of your care. ? ? ? ? ?  ?Sabas Sous, MD ?09/05/21 [phone] ? ?

## 2021-09-05 NOTE — Discharge Instructions (Signed)
You were evaluated in the Emergency Department and after careful evaluation, we did not find any emergent condition requiring admission or further testing in the hospital. ? ?Your exam/testing today was overall reassuring.  Consistent with viral illness.  Follow-up on your COVID and flu swab results. ? ?Please return to the Emergency Department if you experience any worsening of your condition.  Thank you for allowing [place] to be a part of your care. ? ?

## 2023-02-15 ENCOUNTER — Ambulatory Visit (HOSPITAL_COMMUNITY): Payer: MEDICAID

## 2023-02-19 ENCOUNTER — Ambulatory Visit (HOSPITAL_COMMUNITY): Payer: MEDICAID | Attending: Trauma Surgery

## 2023-02-19 ENCOUNTER — Other Ambulatory Visit: Payer: Self-pay

## 2023-02-19 DIAGNOSIS — R262 Difficulty in walking, not elsewhere classified: Secondary | ICD-10-CM | POA: Diagnosis present

## 2023-02-19 DIAGNOSIS — M79605 Pain in left leg: Secondary | ICD-10-CM | POA: Insufficient documentation

## 2023-02-19 DIAGNOSIS — Z8781 Personal history of (healed) traumatic fracture: Secondary | ICD-10-CM | POA: Diagnosis present

## 2023-02-19 NOTE — Therapy (Signed)
OUTPATIENT PHYSICAL THERAPY EVALUATION (LOWER EXTREMITY)   Patient Name: Brady Nunez MRN: [id number] DOB:10-12-1990, 32 y.o., male Today's Date: 02/19/2023  END OF SESSION:   PT End of Session - 02/19/23 1034     Visit Number 1    Number of Visits 8    Date for PT Re-Evaluation 04/16/23    Authorization Type [employer](limit- no; auth- no)    PT Start Time 1030    PT Stop Time 1110    PT Time Calculation (min) 40 min    Activity Tolerance Patient tolerated treatment well              Past Medical History:  Diagnosis Date   Renal disorder    kidney stone   Sickle cell trait (HCC)    No past surgical history on file. There are no problems to display for this patient.   PCP: Pcp, NoPCP - General   REFERRING PROVIDER: Ernst Bowler, MDRef Provider   REFERRING DIAG: [phone] (ICD-10-CM) - Displaced bicondylar fracture of left tibia, initial encounter for closed fracture   Rationale for Evaluation and Treatment: Rehabilitation  THERAPY DIAG:  Difficulty in walking, not elsewhere classified  Pain in left leg  Status post closed tibia fracture  ONSET DATE: Oct 31, 2022 --------------------------------------------------------------------------------------------- SUBJECTIVE:                                                                                                                                                                                           SUBJECTIVE STATEMENT: Patient was doing a blackflip and fractured left tibia on 10/31/22. Patient was in prison for two weeks. Surgery was done on May 15th, closed reduction. Patient was released from prison. Patient was put into brace; wore brace from June 2024 until Aug 1st, 2024 where MD cleared patient; referred to OPPT  PERTINENT HISTORY:  N/a  PAIN:  Are you having pain? Yes: NPRS scale: 6/10 Pain location: left lef, tibia Pain description: dull, numb Aggravating factors: any movements Relieving  factors: rest, pain medicine   PRECAUTIONS: None  RED FLAGS: None   WEIGHT BEARING RESTRICTIONS: No  FALLS:  Has patient fallen in last 6 months? 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 patient does not feel he can  PLOF: Independent  PATIENT GOALS: To be able to walk pain free  NEXT MD VISIT: TBD --------------------------------------------------------------------------------------------- OBJECTIVE:   DIAGNOSTIC FINDINGS:  Patient was seen in [place]- no imaging on mychart  PATIENT  SURVEYS:  LEFS Lower Extremity Functional Score: 23 / 80 = 28.7 %  SCREENING FOR RED FLAGS: Bowel or bladder incontinence: No Spinal tumors: No Cauda equina syndrome: No Compression fracture: No Abdominal aneurysm: No  COGNITION: Overall cognitive status: Within functional limits for tasks assessed  POSTURE: No Significant postural limitations      FUNCTIONAL TESTS:  2 minute walk test: 253ft pt using  single point cane on left UE; moderate antalgia/pain    GAIT ANALYSIS: Distance walked: 61ft Assistive device utilized: Single point cane Level of assistance: Complete Independence Comments: Patient ambulates into clinic with single point cane left UE; MOD antalgia, increased knee flexion in left knee during stance  SENSATION: Light touch: Impaired along L5 dermatome    LOWER EXTREMITY MMT:    Lower extremity grossly 4/5 on left except; left ankle Eversion/dorsiflexion 3+/5 LOWER EXTREMITY ROM:     Left lower extremity grossly WFL ANKLE/HIP SPECIAL TESTS Not indicated due to post-op status  PALPATION: MOD tenderness to palpation along incision on left tibia  INTEGUMENTARY   Incision C/D/I with no signs of infection   --------------------------------------------------------------------------------------------- TODAY'S TREATMENT:                                                                                                                              DATE:   02/19/23 PT Initial Eval- Low Complexity  Therapeutic exercise x57min -Supine Quad Sets on left, 10x5" -Supine SLR on left with quad set 2x10 -Standing left TKE with right theraband x10   PATIENT EDUCATION:  Education details: assistive device use, pain management, HEP Person educated: Patient Education method: Medical illustrator Education comprehension: verbalized understanding  HOME EXERCISE PROGRAM: -Supine Quad Sets on left, 10x5" -Supine SLR on left with quad set 2x10 -Standing left TKE with right theraband x10 --------------------------------------------------------------------------------------------- ASSESSMENT:  CLINICAL IMPRESSION: Patient is a 32 y.o. y.o. male who was seen today for physical therapy evaluation and treatment for left leg pain, difficulty walking post/op closed tibia fx 10/31/22. Patient presents to PT with the following objective impairments: Abnormal gait, difficulty walking, decreased ROM, decreased strength, and pain. These impairments limit the patient in activities such as carrying, lifting, bending, sitting, standing, squatting, stairs, and locomotion level. These impairments also limit the patient in participation such as meal prep, cleaning, driving, occupation, and yard work. The patient will benefit from PT to address the limitations/impairments listed below to return to their prior level of function in the domains of activity and participation.    PERSONAL FACTORS:  n/a  are also affecting patient's functional outcome.   REHAB POTENTIAL: Excellent  CLINICAL DECISION MAKING: Stable/uncomplicated  EVALUATION COMPLEXITY:  Low  --------------------------------------------------------------------------------------------- GOALS: Goals reviewed with patient? Yes  SHORT TERM GOALS: Target date: 03/19/2023    Patient will be able to complete 300 ft on the Test to improve lower extremity strength to facilitate safe ambulation around home and community  Baseline: Goal status: INITIAL  2.  Patient will  score a >/=35 on the  LEFS  to demonstrate an improvement in ADL completion, stair negotiation, household/community ambulation, and self-care Baseline:  Goal status: INITIAL  3. Patient will be independent with a basic stretching/strengthening HEP  Baseline:  Goal status: INITIAL   LONG TERM GOALS: Target date: 04/16/2023    Patient will be able to complete 400 ft on the Test to improve lower extremity strength to facilitate safe ambulation around Baseline:  Goal status: INITIAL  2.  Patient will score a >/=50 on the  LEFS  to demonstrate an improvement in ADL completion, stair negotiation, household/community ambulation, and self-care Baseline:  Goal status: INITIAL  3.  Patient will be independent with a comprehensive strengthening HEP  Baseline:  Goal status: INITIAL  --------------------------------------------------------------------------------------------- PLAN:  PT FREQUENCY: 2x/week  PT DURATION: 6 weeks  PLANNED INTERVENTIONS: Therapeutic exercises, Therapeutic activity, Neuromuscular re-education, Balance training, Gait training, Patient/Family education, Self Care, and Joint mobilization.  PLAN FOR NEXT SESSION: Progress left Quad strength    Seymour Bars, PT 02/19/2023, 12:56 PM

## 2023-02-24 ENCOUNTER — Ambulatory Visit (HOSPITAL_COMMUNITY): Payer: MEDICAID

## 2023-02-24 ENCOUNTER — Encounter (HOSPITAL_COMMUNITY): Payer: Self-pay

## 2023-02-24 DIAGNOSIS — R262 Difficulty in walking, not elsewhere classified: Secondary | ICD-10-CM | POA: Diagnosis not present

## 2023-02-24 DIAGNOSIS — Z8781 Personal history of (healed) traumatic fracture: Secondary | ICD-10-CM

## 2023-02-24 DIAGNOSIS — M79605 Pain in left leg: Secondary | ICD-10-CM

## 2023-02-24 NOTE — Therapy (Signed)
OUTPATIENT PHYSICAL THERAPY EVALUATION (LOWER EXTREMITY)   Patient Name: Brady Nunez MRN: [id number] DOB:1991-03-25, 32 y.o., male Today's Date: 02/24/2023  END OF SESSION:   PT End of Session - 02/24/23 1444     Visit Number 2    Number of Visits 8    Date for PT Re-Evaluation 04/16/23    Authorization Type [employer](limit- no; auth- no)    PT Start Time 1444    PT Stop Time 1525    PT Time Calculation (min) 41 min    Activity Tolerance Patient tolerated treatment well    Behavior During Therapy WFL for tasks assessed/performed              Past Medical History:  Diagnosis Date   Renal disorder    kidney stone   Sickle cell trait (HCC)    History reviewed. No pertinent surgical history. There are no problems to display for this patient.   PCP: Pcp, NoPCP - General   REFERRING PROVIDER: Ernst Bowler, MDRef Provider   REFERRING DIAG: [phone] (ICD-10-CM) - Displaced bicondylar fracture of left tibia, initial encounter for closed fracture   Rationale for Evaluation and Treatment: Rehabilitation  THERAPY DIAG:  Difficulty in walking, not elsewhere classified  Pain in left leg  Status post closed tibia fracture  ONSET DATE: Oct 31, 2022 --------------------------------------------------------------------------------------------- SUBJECTIVE:                                                                                                                                                                                           SUBJECTIVE STATEMENT: 02/24/23:  Pt arrived without AD, reports changing UE helped a lot and no reports of pain today.  Reports he walked 6 blocks to work today.  Has began HEP without questions and feels they are helpful.    Patient was doing a blackflip and fractured left tibia on 10/31/22. Patient was in prison for two weeks. Surgery was done on May 15th, closed reduction. Patient was released from prison. Patient was put into brace;  wore brace from June 2024 until Aug 1st, 2024 where MD cleared patient; referred to OPPT  PERTINENT HISTORY:  N/a  PAIN:  Are you having pain? Yes: NPRS scale: 6/10 Pain location: left lef, tibia Pain description: dull, numb Aggravating factors: any movements Relieving factors: rest, pain medicine   PRECAUTIONS: None  RED FLAGS: None   WEIGHT BEARING RESTRICTIONS: No  FALLS:  Has patient fallen in last 6 months? No  LIVING ENVIRONMENT: Lives with: lives with their family Lives in: House/apartment Stairs: No Has following equipment at home: Single point cane  OCCUPATION: Cook; on feet  all day. MD cleared pt for work but patient does not feel he can  PLOF: Independent  PATIENT GOALS: To be able to walk pain free  NEXT MD VISIT: TBD --------------------------------------------------------------------------------------------- OBJECTIVE:   DIAGNOSTIC FINDINGS:  Patient was seen in [facility]- no imaging on mychart  PATIENT SURVEYS:  LEFS Lower Extremity Functional Score: 23 / 80 = 28.7 %  SCREENING FOR RED FLAGS: Bowel or bladder incontinence: No Spinal tumors: No Cauda equina syndrome: No Compression fracture: No Abdominal aneurysm: No  COGNITION: Overall cognitive status: Within functional limits for tasks assessed  POSTURE: No Significant postural limitations      FUNCTIONAL TESTS:  2 minute walk test: 245ft pt using  single point cane on left UE; moderate antalgia/pain    GAIT ANALYSIS: Distance walked: 21ft Assistive device utilized: Single point cane Level of assistance: Complete Independence Comments: Patient ambulates into clinic with single point cane left UE; MOD antalgia, increased knee flexion in left knee during stance  SENSATION: Light touch: Impaired along L5 dermatome    LOWER EXTREMITY MMT:     Lower extremity grossly 4/5 on left except; left ankle Eversion/dorsiflexion 3+/5 LOWER EXTREMITY ROM:     Left lower extremity grossly  WFL ANKLE/HIP SPECIAL TESTS Not indicated due to post-op status  PALPATION: MOD tenderness to palpation along incision on left tibia  INTEGUMENTARY   Incision C/D/I with no signs of infection  --------------------------------------------------------------------------------------------- TODAY'S TREATMENT:                                                                                                                              DATE:  02/24/23: Reviewed goals Educated importance of HEP compliance for maximal benefits  Supine:  Quad sets on left 10x5"  SLR with quad set 15  Bridge 15x Sidelying:  Abduction 10x Prone:   Hip extension 15x BLE Seated:  LAQ 4# 2x 10  BTB Ankle dorsiflexion/eversion 15x 5" Standing:  TKE 15x 5" with GTB  Squat 15x  Heel and toe raises on declined and incline   Lateral step up 4 then 6in 15 each  Forward step up 6in 15x no HHA   02/19/23 PT Initial Eval- Low Complexity  Therapeutic exercise x63min -Supine Quad Sets on left, 10x5" -Supine SLR on left with quad set 2x10 -Standing left TKE with right theraband x10   PATIENT EDUCATION:  Education details: assistive device use, pain management, HEP Person educated: Patient Education method: Medical illustrator Education comprehension: verbalized understanding  HOME EXERCISE PROGRAM: -Supine Quad Sets on left, 10x5" -Supine SLR on left with quad set 2x10 -Standing left TKE with right theraband x10  Access Code: [email or web address] URL: [email or web address] Date: 02/24/2023 Prepared by: Becky Sax  Exercises - Supine Quad Set  - 1 x daily - 7 x weekly - 3 sets - 10 reps - Straight Leg Raise  - 1 x daily - 7 x weekly - 3 sets - 10 reps - Standing Terminal Knee Extension  with Resistance  - 1 x daily - 7 x weekly - 3 sets - 10 reps - Squat with Chair Touch  - 1 x daily - 7 x weekly - 3 sets - 10 reps - Heel Toe Raises with Counter Support  - 1 x daily - 7 x weekly - 3  sets - 10 reps - Seated Ankle Eversion with Anchored Resistance  - 1 x daily - 7 x weekly - 3 sets - 10 reps --------------------------------------------------------------------------------------------- ASSESSMENT:  CLINICAL IMPRESSION: 02/24/23:  Reviewed goals and educated importance of HEP compliance.  Pt able to recall and demonstrate appropriate mechanics with current exercise program.  Session focus with quad/proximal as well as functional strengthening.  Pt tolerated sessions and is progressing well.  Pt able to ambulate with improved mechanics and minimal antalgic gait mechanics with no AD.  Added quad and proximal strengthening exercises to HEP with handout and verbalized understanding.    Eval:  Patient is a 32 y.o. y.o. male who was seen today for physical therapy evaluation and treatment for left leg pain, difficulty walking post/op closed tibia fx 10/31/22. Patient presents to PT with the following objective impairments: Abnormal gait, difficulty walking, decreased ROM, decreased strength, and pain. These impairments limit the patient in activities such as carrying, lifting, bending, sitting, standing, squatting, stairs, and locomotion level. These impairments also limit the patient in participation such as meal prep, cleaning, driving, occupation, and yard work. The patient will benefit from PT to address the limitations/impairments listed below to return to their prior level of function in the domains of activity and participation.    PERSONAL FACTORS:  n/a  are also affecting patient's functional outcome.   REHAB POTENTIAL: Excellent  CLINICAL DECISION MAKING: Stable/uncomplicated  EVALUATION COMPLEXITY: Low  --------------------------------------------------------------------------------------------- GOALS: Goals reviewed with patient? Yes  SHORT TERM GOALS: Target date: 03/19/2023    Patient will be able to complete 300 ft on the Test to improve lower extremity strength  to facilitate safe ambulation around home and community  Baseline: Goal status: IN PROGRESS  2.  Patient will score a >/=35 on the  LEFS  to demonstrate an improvement in ADL completion, stair negotiation, household/community ambulation, and self-care Baseline:  Goal status: IN PROGRESS  3. Patient will be independent with a basic stretching/strengthening HEP  Baseline:  Goal status: IN PROGRESS   LONG TERM GOALS: Target date: 04/16/2023    Patient will be able to complete 400 ft on the Test to improve lower extremity strength to facilitate safe ambulation around Baseline:  Goal status: IN PROGRESS  2.  Patient will score a >/=50 on the  LEFS  to demonstrate an improvement in ADL completion, stair negotiation, household/community ambulation, and self-care Baseline:  Goal status: IN PROGRESS  3.  Patient will be independent with a comprehensive strengthening HEP  Baseline:  Goal status: IN PROGRESS  --------------------------------------------------------------------------------------------- PLAN:  PT FREQUENCY: 2x/week  PT DURATION: 6 weeks  PLANNED INTERVENTIONS: Therapeutic exercises, Therapeutic activity, Neuromuscular re-education, Balance training, Gait training, Patient/Family education, Self Care, and Joint mobilization.  PLAN FOR NEXT SESSION: Progress left Quad strength.  Added incline slope treadmill and progress to retrogait with decline slope for quad strengthening.  Begin step down and increase height as able also SLS/vector stance for hip stability.  Becky Sax, LPTA/CLT; CBIS [phone]  Juel Burrow, PTA 02/24/2023, 3:43 PM

## 2023-02-26 ENCOUNTER — Ambulatory Visit (HOSPITAL_COMMUNITY): Payer: MEDICAID

## 2023-02-26 DIAGNOSIS — Z8781 Personal history of (healed) traumatic fracture: Secondary | ICD-10-CM

## 2023-02-26 DIAGNOSIS — R262 Difficulty in walking, not elsewhere classified: Secondary | ICD-10-CM | POA: Diagnosis not present

## 2023-02-26 DIAGNOSIS — M79605 Pain in left leg: Secondary | ICD-10-CM

## 2023-02-26 NOTE — Therapy (Signed)
OUTPATIENT PHYSICAL THERAPY EVALUATION (LOWER EXTREMITY)   Patient Name: Brady Nunez MRN: [id number] DOB:April 22, 1991, 32 y.o., male Today's Date: 02/26/2023  END OF SESSION:   PT End of Session - 02/26/23 0940     Visit Number 3    Number of Visits 8    Date for PT Re-Evaluation 04/16/23    Authorization Type [employer](limit- no; auth- no)    PT Start Time 0945    PT Stop Time 1025    PT Time Calculation (min) 40 min    Activity Tolerance Patient tolerated treatment well    Behavior During Therapy WFL for tasks assessed/performed              Past Medical History:  Diagnosis Date   Renal disorder    kidney stone   Sickle cell trait (HCC)    No past surgical history on file. There are no problems to display for this patient.   PCP: Pcp, NoPCP - General   REFERRING PROVIDER: Ernst Bowler, MDRef Provider   REFERRING DIAG: [phone] (ICD-10-CM) - Displaced bicondylar fracture of left tibia, initial encounter for closed fracture   Rationale for Evaluation and Treatment: Rehabilitation  THERAPY DIAG:  No diagnosis found.  ONSET DATE: Oct 31, 2022 --------------------------------------------------------------------------------------------- SUBJECTIVE:                                                                                                                                                                                           SUBJECTIVE STATEMENT: 02/24/23:  Patient reports returning back to work for 3 hour shift; able to tolerate with no pain    IE: Patient was doing a backflip and fractured left tibia on 10/31/22. Patient was in prison for two weeks. Surgery was done on May 15th, closed reduction. Patient was released from prison. Patient was put into brace; wore brace from June 2024 until Aug 1st, 2024 where MD cleared patient; referred to OPPT  PERTINENT HISTORY:  N/a  PAIN:  Are you having pain? Yes: NPRS scale: 6/10 Pain location: left lef,  tibia Pain description: dull, numb Aggravating factors: any movements Relieving factors: rest, pain medicine   PRECAUTIONS: None  RED FLAGS: None   WEIGHT BEARING RESTRICTIONS: No  FALLS:  Has patient fallen in last 6 months? 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 patient does not feel he can  PLOF: Independent  PATIENT GOALS: To be able to walk pain free  NEXT MD VISIT: TBD ---------------------------------------------------------------------------------------------  OBJECTIVE:   DIAGNOSTIC FINDINGS:  Patient was seen in [facility]- no imaging on mychart  PATIENT SURVEYS:  LEFS Lower Extremity Functional Score: 23 / 80 = 28.7 %  SCREENING FOR RED FLAGS: Bowel or bladder incontinence: No Spinal tumors: No Cauda equina syndrome: No Compression fracture: No Abdominal aneurysm: No  COGNITION: Overall cognitive status: Within functional limits for tasks assessed  POSTURE: No Significant postural limitations      FUNCTIONAL TESTS:  2 minute walk test: 271ft pt using  single point cane on left UE; moderate antalgia/pain    GAIT ANALYSIS: Distance walked: 80ft Assistive device utilized: Single point cane Level of assistance: Complete Independence Comments: Patient ambulates into clinic with single point cane left UE; MOD antalgia, increased knee flexion in left knee during stance  SENSATION: Light touch: Impaired along L5 dermatome    LOWER EXTREMITY MMT:     Lower extremity grossly 4/5 on left except; left ankle Eversion/dorsiflexion 3+/5 LOWER EXTREMITY ROM:     Left lower extremity grossly WFL ANKLE/HIP SPECIAL TESTS Not indicated due to post-op status  PALPATION: MOD tenderness to palpation along incision on left tibia  INTEGUMENTARY   Incision C/D/I with no signs of  infection  --------------------------------------------------------------------------------------------- TODAY'S TREATMENT:                                                                                                                              DATE:   02/26/23 Manual therapy x83min  -mobilization with movement on left lower extremity for dorsiflexion ROM -AP glides Gr 4-5 left ankle Neuromuscular re-education x68min -Supine D2 Ankle dorsiflexion/ PF with right theraband for ankle stability -step downs on 6" step with contralateral UE assist  -seated leg extension machine 2x10 with 20#    02/24/23: Reviewed goals Educated importance of HEP compliance for maximal benefits  Supine:  Quad sets on left 10x5"  SLR with quad set 15  Bridge 15x Sidelying:  Abduction 10x Prone:   Hip extension 15x BLE Seated:  LAQ 4# 2x 10  BTB Ankle dorsiflexion/eversion 15x 5" Standing:  TKE 15x 5" with GTB  Squat 15x  Heel and toe raises on declined and incline   Lateral step up 4 then 6in 15 each  Forward step up 6in 15x no HHA   PATIENT EDUCATION:  Education details: assistive device use, pain management, HEP Person educated: Patient Education method: Medical illustrator Education comprehension: verbalized understanding  HOME EXERCISE PROGRAM: Access Code: [email or web address] URL: [email or web address] Date: 02/26/2023 Prepared by: Seymour Bars  Exercises - Straight Leg Raise  - 1 x daily - 20 reps - Long Sitting Ankle PNF D2 Plantarflexion with Resistance  - 1 x daily - 20 reps - Long Sitting Ankle PNF D2 Dorsiflexion with Resistance  - 1 x daily - 20 reps - Heel Toe Raises with Counter Support  - 20 reps - Squat with Chair Touch  - 20 reps - Forward Step Down  - 1 x daily -  20 reps --------------------------------------------------------------------------------------------- ASSESSMENT:  CLINICAL IMPRESSION: Today:  Reviewed goals and educated importance of  HEP compliance. PT investigated left ankle active range of motion into dorsiflexion; left ankle lacking 25% active range of motion versus right lower extremity. Session focused on improving left ankle active range of motion followed by updating HEP to include left ankle stability. Pt verbalized understanding of updated HEP.    Eval:  Patient is a 32 y.o. y.o. male who was seen today for physical therapy evaluation and treatment for left leg pain, difficulty walking post/op closed tibia fx 10/31/22. Patient presents to PT with the following objective impairments: Abnormal gait, difficulty walking, decreased ROM, decreased strength, and pain. These impairments limit the patient in activities such as carrying, lifting, bending, sitting, standing, squatting, stairs, and locomotion level. These impairments also limit the patient in participation such as meal prep, cleaning, driving, occupation, and yard work. The patient will benefit from PT to address the limitations/impairments listed below to return to their prior level of function in the domains of activity and participation.    PERSONAL FACTORS:  n/a  are also affecting patient's functional outcome.   REHAB POTENTIAL: Excellent  CLINICAL DECISION MAKING: Stable/uncomplicated  EVALUATION COMPLEXITY: Low  --------------------------------------------------------------------------------------------- GOALS: Goals reviewed with patient? Yes  SHORT TERM GOALS: Target date: 03/19/2023    Patient will be able to complete 300 ft on the Test to improve lower extremity strength to facilitate safe ambulation around home and community  Baseline: Goal status: IN PROGRESS  2.  Patient will score a >/=35 on the  LEFS  to demonstrate an improvement in ADL completion, stair negotiation, household/community ambulation, and self-care Baseline:  Goal status: IN PROGRESS  3. Patient will be independent with a basic stretching/strengthening HEP  Baseline:   Goal status: IN PROGRESS   LONG TERM GOALS: Target date: 04/16/2023    Patient will be able to complete 400 ft on the Test to improve lower extremity strength to facilitate safe ambulation around Baseline:  Goal status: IN PROGRESS  2.  Patient will score a >/=50 on the  LEFS  to demonstrate an improvement in ADL completion, stair negotiation, household/community ambulation, and self-care Baseline:  Goal status: IN PROGRESS  3.  Patient will be independent with a comprehensive strengthening HEP  Baseline:  Goal status: IN PROGRESS  --------------------------------------------------------------------------------------------- PLAN:  PT FREQUENCY: 2x/week  PT DURATION: 6 weeks  PLANNED INTERVENTIONS: Therapeutic exercises, Therapeutic activity, Neuromuscular re-education, Balance training, Gait training, Patient/Family education, Self Care, and Joint mobilization.  PLAN FOR NEXT SESSION: Progress left Quad strength.  Added incline slope treadmill and progress to retrogait with decline slope for quad strengthening.  Begin step down and increase height as able also SLS/vector stance for hip stability.  Becky Sax, LPTA/CLT; CBIS [phone]  Seymour Bars, PT 02/26/2023, 9:41 AM

## 2023-03-03 ENCOUNTER — Emergency Department (HOSPITAL_COMMUNITY)
Admission: EM | Admit: 2023-03-03 | Discharge: 2023-03-03 | Disposition: A | Discharge status: Home / Self Care | Payer: MEDICAID | Attending: Emergency Medicine | Admitting: Emergency Medicine

## 2023-03-03 ENCOUNTER — Other Ambulatory Visit: Payer: Self-pay

## 2023-03-03 ENCOUNTER — Encounter (HOSPITAL_COMMUNITY): Payer: Self-pay

## 2023-03-03 DIAGNOSIS — T63441A Toxic effect of venom of bees, accidental (unintentional), initial encounter: Secondary | ICD-10-CM | POA: Diagnosis present

## 2023-03-03 MED ORDER — DIPHENHYDRAMINE HCL 50 MG/ML IJ SOLN
25.0000 mg | Freq: Once | INTRAMUSCULAR | Status: AC
  Administered 2023-03-03: 25 mg via INTRAVENOUS
  Filled 2023-03-03: qty 1

## 2023-03-03 MED ORDER — PREDNISONE 20 MG PO TABS
40.0000 mg | ORAL_TABLET | Freq: Every day | ORAL | 0 refills | Status: AC
Start: 2023-03-03 — End: ?

## 2023-03-03 MED ORDER — DEXAMETHASONE SODIUM PHOSPHATE 10 MG/ML IJ SOLN
10.0000 mg | Freq: Once | INTRAMUSCULAR | Status: AC
  Administered 2023-03-03: 10 mg via INTRAVENOUS
  Filled 2023-03-03: qty 1

## 2023-03-03 MED ORDER — FAMOTIDINE IN NACL 20-0.9 MG/50ML-% IV SOLN
20.0000 mg | Freq: Once | INTRAVENOUS | Status: AC
  Administered 2023-03-03: 20 mg via INTRAVENOUS
  Filled 2023-03-03: qty 50

## 2023-03-03 NOTE — ED Provider Notes (Signed)
[place] EMERGENCY DEPARTMENT AT [facility] Provider Note   CSN: [id number] Arrival date & time: 03/03/23  1440     History  Chief Complaint  Patient presents with   Insect Bite    JAVONTEZ BACANI is a 32 y.o. male.  HPI     CARLITO SENSAT is a 32 y.o. male who presents to the Emergency Department complaining of pain redness and swelling of his left face since earlier today.  States around 2:30 PM he was stung over his left eye by a hornet.  Notes history of hives with prior bee stings.  No history of anaphylaxis.  He has had gradually worsening pain of his face with swelling of his cheek and left eyelids.  Denies any shortness of breath, chest pain, nausea, vomiting, swelling of his lips or tongue, or difficulty swallowing.  No treatments prior to arrival  Home Medications Prior to Admission medications   Medication Sig Start Date End Date Taking? Authorizing Provider  benzonatate (TESSALON) 100 MG capsule Take 2 capsules (200 mg total) by mouth 3 (three) times daily as needed. 03/16/16   Burgess Amor, PA-C  diphenhydrAMINE (BENADRYL) 25 MG tablet Take 2 tablets (50 mg total) by mouth every 6 (six) hours as needed. 05/13/15   Ward, Layla Maw, DO  famotidine (PEPCID) 20 MG tablet Take 1 tablet (20 mg total) by mouth 2 (two) times daily. Patient not taking: Reported on 07/25/2015 05/13/15   Ward, Layla Maw, DO  predniSONE (DELTASONE) 20 MG tablet Take 3 tablets (60 mg total) by mouth daily. Patient not taking: Reported on 07/25/2015 05/13/15   Ward, Layla Maw, DO      Allergies    Tramadol    Review of Systems   Review of Systems  Constitutional:  Negative for chills and fever.  HENT:  Positive for facial swelling. Negative for sore throat, trouble swallowing and voice change.   Eyes:  Positive for pain.       Bee sting left eyebrow area  Respiratory:  Negative for cough, chest tightness and shortness of breath.   Cardiovascular:  Negative for chest pain.   Gastrointestinal:  Negative for abdominal pain, nausea and vomiting.  Skin:  Positive for color change.  Neurological:  Positive for headaches. Negative for dizziness, syncope and weakness.  Psychiatric/Behavioral:  Negative for confusion.     Physical Exam Updated Vital Signs BP 134/80 (BP Location: Right Arm)   Pulse 94   Temp 99 F (37.2 C) (Oral)   Resp 17   Ht 5\' 7"  (1.702 m)   Wt 65.8 kg   SpO2 100%   BMI 22.71 kg/m  Physical Exam Vitals and nursing note reviewed.  Constitutional:      General: He is not in acute distress.    Appearance: Normal appearance. He is not ill-appearing or toxic-appearing.  HENT:     Head:     Comments: Erythema and edema of the left face including the left periorbital region, left cheek    Mouth/Throat:     Mouth: Mucous membranes are moist.     Pharynx: Oropharynx is clear.     Comments: No edema of the oropharynx lips or tongue.  Uvula midline nonedematous Eyes:     Extraocular Movements: Extraocular movements intact.     Conjunctiva/sclera: Conjunctivae normal.     Pupils: Pupils are equal, round, and reactive to light.     Comments: Edema of the left upper eyelid  Cardiovascular:     Rate  and Rhythm: Normal rate and regular rhythm.     Pulses: Normal pulses.  Pulmonary:     Effort: Pulmonary effort is normal. No respiratory distress.     Breath sounds: No wheezing.  Chest:     Chest wall: No tenderness.  Abdominal:     Palpations: Abdomen is soft.     Tenderness: There is no abdominal tenderness.  Musculoskeletal:        General: Normal range of motion.  Skin:    General: Skin is warm.     Capillary Refill: Capillary refill takes less than 2 seconds.     Findings: Erythema present. No bruising.  Neurological:     General: No focal deficit present.     Mental Status: He is alert.     Sensory: No sensory deficit.     Motor: No weakness.     ED Results / Procedures / Treatments   Labs (all labs ordered are listed, but  only abnormal results are displayed) Labs Reviewed - No data to display  EKG None  Radiology No results found.  Procedures Procedures    Medications Ordered in ED Medications  diphenhydrAMINE (BENADRYL) injection 25 mg (has no administration in time range)  dexamethasone (DECADRON) injection 10 mg (has no administration in time range)  famotidine (PEPCID) IVPB 20 mg premix (has no administration in time range)    ED Course/ Medical Decision Making/ A&P                                 Medical Decision Making Patient here for evaluation of bee sting to his left eyebrow area just before ER arrival.  Stung by a hornet.  No history of anaphylactic reactions but notes prior hives with bee stings.  Has not required epinephrine previously.  Denies any chest pain, shortness of breath difficulty swallowing nausea vomiting or chest pain.  Symptoms currently localized to the left face airways patent.  Differential would include localized reaction versus anaphylaxis.  Will treat patient and observe  Amount and/or Complexity of Data Reviewed Discussion of management or test interpretation with external provider(s): Pt with localized reaction to bee sting.  Observed w/o complication.  No indication for need for epinephrine.    Pt treated with ice, steroid, Pepcid, benadryl.  On recheck, edema and erythema have improved.  No worsening symptoms after lengthy observation in the dept.  Pt reports feeling better, hungry and ready to be discharged.  Tolerating fluids well. I feel his is appropriate for d/c at this time, has benadryl at home rx written for additional steroid.  Strict return precautions discussed  Risk Prescription drug management.           Final Clinical Impression(s) / ED Diagnoses Final diagnoses:  Bee sting, accidental or unintentional, initial encounter    Rx / DC Orders ED Discharge Orders     None         Pauline Aus, PA-C 03/04/23 1156    Terrilee Files, MD 03/05/23 [phone]

## 2023-03-03 NOTE — Discharge Instructions (Signed)
You may continue to use the ice packs on and off to help with swelling.  Take over-the-counter Benadryl 1 capsule every 4-6 hours for the next 2 to 3 days.  Start the prednisone prescription tomorrow.  Return to the emergency department for any new or worsening symptoms.

## 2023-03-03 NOTE — ED Triage Notes (Addendum)
C/o bee sting to left eye PTA Swelling and redness noted on arrival.  Pt reports blurry vision to left eye

## 2023-03-05 ENCOUNTER — Ambulatory Visit (HOSPITAL_COMMUNITY): Payer: MEDICAID | Attending: Trauma Surgery

## 2023-03-05 DIAGNOSIS — Z8781 Personal history of (healed) traumatic fracture: Secondary | ICD-10-CM | POA: Diagnosis present

## 2023-03-05 DIAGNOSIS — M79605 Pain in left leg: Secondary | ICD-10-CM | POA: Diagnosis present

## 2023-03-05 DIAGNOSIS — R262 Difficulty in walking, not elsewhere classified: Secondary | ICD-10-CM | POA: Diagnosis present

## 2023-03-05 NOTE — Therapy (Signed)
OUTPATIENT PHYSICAL THERAPY EVALUATION (LOWER EXTREMITY)   Patient Name: Brady Nunez MRN: [id number] DOB:08-Jul-1990, 32 y.o., male Today's Date: 03/05/2023  END OF SESSION:   PT End of Session - 03/05/23 0904     Visit Number 4    Number of Visits 8    Date for PT Re-Evaluation 04/16/23    Authorization Type [employer](limit- no; auth- no)    PT Start Time 0900    PT Stop Time 0940    PT Time Calculation (min) 40 min    Activity Tolerance Patient tolerated treatment well    Behavior During Therapy WFL for tasks assessed/performed               Past Medical History:  Diagnosis Date   Renal disorder    kidney stone   Sickle cell trait (HCC)    No past surgical history on file. There are no problems to display for this patient.   PCP: Pcp, NoPCP - General   REFERRING PROVIDER: Ernst Bowler, MDRef Provider   REFERRING DIAG: [phone] (ICD-10-CM) - Displaced bicondylar fracture of left tibia, initial encounter for closed fracture   Rationale for Evaluation and Treatment: Rehabilitation  THERAPY DIAG:  Difficulty in walking, not elsewhere classified  Pain in left leg  Status post closed tibia fracture  ONSET DATE: Oct 31, 2022 --------------------------------------------------------------------------------------------- SUBJECTIVE:                                                                                                                                                                                           SUBJECTIVE STATEMENT: Today:  Patient reports have been back to work at about 12-20 hrs. Pt was in ER for bee sting to face 2 days ago; is better now   IE: Patient was doing a backflip and fractured left tibia on 10/31/22. Patient was in prison for two weeks. Surgery was done on May 15th, closed reduction. Patient was released from prison. Patient was put into brace; wore brace from June 2024 until Aug 1st, 2024 where MD cleared patient; referred to  OPPT  PERTINENT HISTORY:  N/a  PAIN:  Are you having pain? Yes: NPRS scale: 6/10 Pain location: left lef, tibia Pain description: dull, numb Aggravating factors: any movements Relieving factors: rest, pain medicine   PRECAUTIONS: None  RED FLAGS: None   WEIGHT BEARING RESTRICTIONS: No  FALLS:  Has patient fallen in last 6 months? 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 patient  does not feel he can  PLOF: Independent  PATIENT GOALS: To be able to walk pain free  NEXT MD VISIT: TBD --------------------------------------------------------------------------------------------- OBJECTIVE:   DIAGNOSTIC FINDINGS:  Patient was seen in [facility]- no imaging on mychart  PATIENT SURVEYS:  LEFS Lower Extremity Functional Score: 23 / 80 = 28.7 %  SCREENING FOR RED FLAGS: Bowel or bladder incontinence: No Spinal tumors: No Cauda equina syndrome: No Compression fracture: No Abdominal aneurysm: No  COGNITION: Overall cognitive status: Within functional limits for tasks assessed  POSTURE: No Significant postural limitations      FUNCTIONAL TESTS:  2 minute walk test: 279ft pt using  single point cane on left UE; moderate antalgia/pain    GAIT ANALYSIS: Distance walked: 75ft Assistive device utilized: Single point cane Level of assistance: Complete Independence Comments: Patient ambulates into clinic with single point cane left UE; MOD antalgia, increased knee flexion in left knee during stance  SENSATION: Light touch: Impaired along L5 dermatome    LOWER EXTREMITY MMT:     Lower extremity grossly 4/5 on left except; left ankle Eversion/dorsiflexion 3+/5 LOWER EXTREMITY ROM:     Left lower extremity grossly WFL ANKLE/HIP SPECIAL TESTS Not indicated due to post-op  status  PALPATION: MOD tenderness to palpation along incision on left tibia  INTEGUMENTARY   Incision C/D/I with no signs of infection  --------------------------------------------------------------------------------------------- TODAY'S TREATMENT:                                                                                                                              DATE:   03/05/23 Neuromuscular re-education x32' -Lunges on 4" step + weighted ball hold in // bars 2x10 -Partial lunges with knee to 4" step in // bars 2x10 -Dynamic lunges with (2)10# dumbbells 2x10 -Single leg hip hinges +foam roller 2x10 -single leg stance on foam pad + ball toss on Rebounder Manual therapy x 8' -instrument-assisted soft tissue mobilization  to both scars/ incision sites for re sensitization abduction scar mobility     02/26/23 Manual therapy x81min  -mobilization with movement on left lower extremity for dorsiflexion ROM -AP glides Gr 4-5 left ankle Neuromuscular re-education x10min -Supine D2 Ankle dorsiflexion/ PF with right theraband for ankle stability -step downs on 6" step with contralateral UE assist  -seated leg extension machine 2x10 with 20#   PATIENT EDUCATION:  Education details: assistive device use, pain management, HEP Person educated: Patient Education method: Medical illustrator Education comprehension: verbalized understanding  HOME EXERCISE PROGRAM: Access Code: [email or web address] URL: [email or web address] Date: 02/26/2023 Prepared by: Seymour Bars  Exercises - Straight Leg Raise  - 1 x daily - 20 reps - Long Sitting Ankle PNF D2 Plantarflexion with Resistance  - 1 x daily - 20 reps - Long Sitting Ankle PNF D2 Dorsiflexion with Resistance  - 1 x daily - 20 reps - Heel Toe Raises with Counter Support  - 20 reps - Squat with Chair Touch  - 20 reps -  Forward Step Down  - 1 x daily - 20  reps --------------------------------------------------------------------------------------------- ASSESSMENT:  CLINICAL IMPRESSION: Today: PT progressed patient into tandem/ single leg positions to facilitate proprioception and dynamic balance. Pt demo's GOOD steadiness on left lower extremity in both tandem and single leg stance with minimal losses of balance. PT to continue to progress neuromuscular re-education in upcoming sessions. Pt will cotninue to benefit from PT to return to prior level of function      Eval:  Patient is a 32 y.o. y.o. male who was seen today for physical therapy evaluation and treatment for left leg pain, difficulty walking post/op closed tibia fx 10/31/22. Patient presents to PT with the following objective impairments: Abnormal gait, difficulty walking, decreased ROM, decreased strength, and pain. These impairments limit the patient in activities such as carrying, lifting, bending, sitting, standing, squatting, stairs, and locomotion level. These impairments also limit the patient in participation such as meal prep, cleaning, driving, occupation, and yard work. The patient will benefit from PT to address the limitations/impairments listed below to return to their prior level of function in the domains of activity and participation.    PERSONAL FACTORS:  n/a  are also affecting patient's functional outcome.   REHAB POTENTIAL: Excellent  CLINICAL DECISION MAKING: Stable/uncomplicated  EVALUATION COMPLEXITY: Low  --------------------------------------------------------------------------------------------- GOALS: Goals reviewed with patient? Yes  SHORT TERM GOALS: Target date: 03/19/2023    Patient will be able to complete 300 ft on the Test to improve lower extremity strength to facilitate safe ambulation around home and community  Baseline: Goal status: IN PROGRESS  2.  Patient will score a >/=35 on the  LEFS  to demonstrate an improvement in ADL  completion, stair negotiation, household/community ambulation, and self-care Baseline:  Goal status: IN PROGRESS  3. Patient will be independent with a basic stretching/strengthening HEP  Baseline:  Goal status: IN PROGRESS   LONG TERM GOALS: Target date: 04/16/2023    Patient will be able to complete 400 ft on the Test to improve lower extremity strength to facilitate safe ambulation around Baseline:  Goal status: IN PROGRESS  2.  Patient will score a >/=50 on the  LEFS  to demonstrate an improvement in ADL completion, stair negotiation, household/community ambulation, and self-care Baseline:  Goal status: IN PROGRESS  3.  Patient will be independent with a comprehensive strengthening HEP  Baseline:  Goal status: IN PROGRESS  --------------------------------------------------------------------------------------------- PLAN:  PT FREQUENCY: 2x/week  PT DURATION: 6 weeks  PLANNED INTERVENTIONS: Therapeutic exercises, Therapeutic activity, Neuromuscular re-education, Balance training, Gait training, Patient/Family education, Self Care, and Joint mobilization.  PLAN FOR NEXT SESSION: Progress left Quad strength.  Added incline slope treadmill and progress to retrogait with decline slope for quad strengthening.  Begin step down and increase height as able also SLS/vector stance for hip stability.  Becky Sax, LPTA/CLT; CBIS [phone]  Seymour Bars, PT 03/05/2023, 9:07 AM

## 2023-03-12 ENCOUNTER — Ambulatory Visit (HOSPITAL_COMMUNITY): Payer: MEDICAID

## 2023-03-12 DIAGNOSIS — R262 Difficulty in walking, not elsewhere classified: Secondary | ICD-10-CM

## 2023-03-12 DIAGNOSIS — M79605 Pain in left leg: Secondary | ICD-10-CM

## 2023-03-12 DIAGNOSIS — Z8781 Personal history of (healed) traumatic fracture: Secondary | ICD-10-CM

## 2023-03-12 NOTE — Therapy (Signed)
OUTPATIENT PHYSICAL THERAPY TREATMENT (LOWER EXTREMITY)   Patient Name: Brady Nunez MRN: [id number] DOB:13-May-1991, 32 y.o., male Today's Date: 03/12/2023  END OF SESSION:   PT End of Session - 03/12/23 0947     Visit Number 5    Number of Visits 8    Date for PT Re-Evaluation 04/16/23    Authorization Type [employer](limit- no; auth- no)    Activity Tolerance Patient tolerated treatment well    Behavior During Therapy WFL for tasks assessed/performed               Past Medical History:  Diagnosis Date   Renal disorder    kidney stone   Sickle cell trait (HCC)    No past surgical history on file. There are no problems to display for this patient.   PCP: Pcp, NoPCP - General   REFERRING PROVIDER: Ernst Bowler, MDRef Provider   REFERRING DIAG: [phone] (ICD-10-CM) - Displaced bicondylar fracture of left tibia, initial encounter for closed fracture   Rationale for Evaluation and Treatment: Rehabilitation  THERAPY DIAG:  Difficulty in walking, not elsewhere classified  Pain in left leg  Status post closed tibia fracture  ONSET DATE: Oct 31, 2022 --------------------------------------------------------------------------------------------- SUBJECTIVE:                                                                                                                                                                                           SUBJECTIVE STATEMENT: Today:  Patient saw his MD on 03/09/13; MD performed XR which showed WFL healing.   IE: Patient was doing a backflip and fractured left tibia on 10/31/22. Patient was in prison for two weeks. Surgery was done on May 15th, closed reduction. Patient was released from prison. Patient was put into brace; wore brace from June 2024 until Aug 1st, 2024 where MD cleared patient; referred to OPPT  PERTINENT HISTORY:  N/a  PAIN:  Are you having pain? Yes: NPRS scale: 3-4/10 Pain location: left lef, tibia Pain  description: dull, numb Aggravating factors: any movements Relieving factors: rest, pain medicine   PRECAUTIONS: None  RED FLAGS: None   WEIGHT BEARING RESTRICTIONS: No  FALLS:  Has patient fallen in last 6 months? 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 patient does not feel he can  PLOF: Independent  PATIENT GOALS: To be able to walk pain free  NEXT MD VISIT: TBD --------------------------------------------------------------------------------------------- OBJECTIVE:   DIAGNOSTIC FINDINGS:  Patient was seen in [place]-  no imaging on mychart  PATIENT SURVEYS:  LEFS Lower Extremity Functional Score: 23 / 80 = 28.7 %  SCREENING FOR RED FLAGS: Bowel or bladder incontinence: No Spinal tumors: No Cauda equina syndrome: No Compression fracture: No Abdominal aneurysm: No  COGNITION: Overall cognitive status: Within functional limits for tasks assessed  POSTURE: No Significant postural limitations      FUNCTIONAL TESTS:  2 minute walk test: 223ft pt using  single point cane on left UE; moderate antalgia/pain    GAIT ANALYSIS: Distance walked: 80ft Assistive device utilized: Single point cane Level of assistance: Complete Independence Comments: Patient ambulates into clinic with single point cane left UE; MOD antalgia, increased knee flexion in left knee during stance  SENSATION: Light touch: Impaired along L5 dermatome    LOWER EXTREMITY MMT:     Lower extremity grossly 4/5 on left except; left ankle Eversion/dorsiflexion 3+/5 LOWER EXTREMITY ROM:     Left lower extremity grossly WFL ANKLE/HIP SPECIAL TESTS Not indicated due to post-op status  PALPATION: MOD tenderness to palpation along incision on left tibia  INTEGUMENTARY   Incision C/D/I with no signs of infection   --------------------------------------------------------------------------------------------- TODAY'S TREATMENT:                                                                                                                              DATE:   03/12/23 Therapeutic exercise x 30' -Bike L3 -Seated leg curls 60# 2x10 -Seated leg press DL, single leg 69# [id number] -calf raises / stretch on slant board Gait Training x10' -forward/Retro TM walking/ incline   03/05/23 Neuromuscular re-education x32' -Lunges on 4" step + weighted ball hold in // bars 2x10 -Partial lunges with knee to 4" step in // bars 2x10 -Dynamic lunges with (2)10# dumbbells 2x10 -Single leg hip hinges +foam roller 2x10 -single leg stance on foam pad + ball toss on Rebounder Manual therapy x 8' -instrument-assisted soft tissue mobilization  to both scars/ incision sites for re sensitization abduction scar mobility     02/26/23 Manual therapy x19min  -mobilization with movement on left lower extremity for dorsiflexion ROM -AP glides Gr 4-5 left ankle Neuromuscular re-education x89min -Supine D2 Ankle dorsiflexion/ PF with right theraband for ankle stability -step downs on 6" step with contralateral UE assist  -seated leg extension machine 2x10 with 20#   PATIENT EDUCATION:  Education details: assistive device use, pain management, HEP Person educated: Patient Education method: Medical illustrator Education comprehension: verbalized understanding  HOME EXERCISE PROGRAM: Access Code: [email or web address] URL: [email or web address] Date: 02/26/2023 Prepared by: Seymour Bars  Exercises - Straight Leg Raise  - 1 x daily - 20 reps - Long Sitting Ankle PNF D2 Plantarflexion with Resistance  - 1 x daily - 20 reps - Long Sitting Ankle PNF D2 Dorsiflexion with Resistance  - 1 x daily - 20 reps - Heel Toe Raises with Counter Support  - 20 reps - Squat with Chair Touch  - 20 reps -  Forward Step Down  - 1 x  daily - 20 reps --------------------------------------------------------------------------------------------- ASSESSMENT:  CLINICAL IMPRESSION: Today: PT progressed patient with increased lower extremity resistance during therapeutic exercise. Patient then proceeded to perform Gait Training on TM incline both forward/retro. Patient with improving gait mechanics  Pt will continue to benefit from PT to return to prior level of function      Eval:  Patient is a 32 y.o. y.o. male who was seen today for physical therapy evaluation and treatment for left leg pain, difficulty walking post/op closed tibia fx 10/31/22. Patient presents to PT with the following objective impairments: Abnormal gait, difficulty walking, decreased ROM, decreased strength, and pain. These impairments limit the patient in activities such as carrying, lifting, bending, sitting, standing, squatting, stairs, and locomotion level. These impairments also limit the patient in participation such as meal prep, cleaning, driving, occupation, and yard work. The patient will benefit from PT to address the limitations/impairments listed below to return to their prior level of function in the domains of activity and participation.    PERSONAL FACTORS:  n/a  are also affecting patient's functional outcome.   REHAB POTENTIAL: Excellent  CLINICAL DECISION MAKING: Stable/uncomplicated  EVALUATION COMPLEXITY: Low  --------------------------------------------------------------------------------------------- GOALS: Goals reviewed with patient? Yes  SHORT TERM GOALS: Target date: 03/19/2023    Patient will be able to complete 300 ft on the Test to improve lower extremity strength to facilitate safe ambulation around home and community  Baseline: Goal status: IN PROGRESS  2.  Patient will score a >/=35 on the  LEFS  to demonstrate an improvement in ADL completion, stair negotiation, household/community ambulation, and  self-care Baseline:  Goal status: IN PROGRESS  3. Patient will be independent with a basic stretching/strengthening HEP  Baseline:  Goal status: IN PROGRESS   LONG TERM GOALS: Target date: 04/16/2023    Patient will be able to complete 400 ft on the Test to improve lower extremity strength to facilitate safe ambulation around Baseline:  Goal status: IN PROGRESS  2.  Patient will score a >/=50 on the  LEFS  to demonstrate an improvement in ADL completion, stair negotiation, household/community ambulation, and self-care Baseline:  Goal status: IN PROGRESS  3.  Patient will be independent with a comprehensive strengthening HEP  Baseline:  Goal status: IN PROGRESS  --------------------------------------------------------------------------------------------- PLAN:  PT FREQUENCY: 2x/week  PT DURATION: 6 weeks  PLANNED INTERVENTIONS: Therapeutic exercises, Therapeutic activity, Neuromuscular re-education, Balance training, Gait training, Patient/Family education, Self Care, and Joint mobilization.  PLAN FOR NEXT SESSION: Progress left Quad strength.  Added incline slope treadmill and progress to retrogait with decline slope for quad strengthening.  Begin step down and increase height as able also SLS/vector stance for hip stability.  Becky Sax, LPTA/CLT; CBIS [phone]  Seymour Bars, PT 03/12/2023, 9:51 AM

## 2023-03-14 ENCOUNTER — Encounter (HOSPITAL_COMMUNITY): Payer: Self-pay

## 2023-03-14 ENCOUNTER — Emergency Department (HOSPITAL_COMMUNITY)
Admission: EM | Admit: 2023-03-14 | Discharge: 2023-03-14 | Disposition: A | Discharge status: Home / Self Care | Payer: MEDICAID | Attending: Emergency Medicine | Admitting: Emergency Medicine

## 2023-03-14 ENCOUNTER — Other Ambulatory Visit: Payer: Self-pay

## 2023-03-14 DIAGNOSIS — R000 Tachycardia, unspecified: Secondary | ICD-10-CM | POA: Insufficient documentation

## 2023-03-14 DIAGNOSIS — F1190 Opioid use, unspecified, uncomplicated: Secondary | ICD-10-CM

## 2023-03-14 DIAGNOSIS — F1110 Opioid abuse, uncomplicated: Secondary | ICD-10-CM | POA: Diagnosis present

## 2023-03-14 DIAGNOSIS — F17210 Nicotine dependence, cigarettes, uncomplicated: Secondary | ICD-10-CM | POA: Insufficient documentation

## 2023-03-14 LAB — COMPREHENSIVE METABOLIC PANEL
ALT: 14 U/L (ref 0–44)
AST: 19 U/L (ref 15–41)
Albumin: 4.4 g/dL (ref 3.5–5.0)
Alkaline Phosphatase: 100 U/L (ref 38–126)
Anion gap: 11 (ref 5–15)
BUN: 9 mg/dL (ref 6–20)
CO2: 24 mmol/L (ref 22–32)
Calcium: 9 mg/dL (ref 8.9–10.3)
Chloride: 104 mmol/L (ref 98–111)
Creatinine, Ser: 1.29 mg/dL — ABNORMAL HIGH (ref 0.61–1.24)
GFR, Estimated: >60 mL/min (ref >60)
Glucose, Bld: 131 mg/dL — ABNORMAL HIGH (ref 70–99)
Potassium: 3.7 mmol/L (ref 3.5–5.1)
Sodium: 139 mmol/L (ref 135–145)
Total Bilirubin: 0.8 mg/dL (ref 0.3–1.2)
Total Protein: 7.6 g/dL (ref 6.5–8.1)

## 2023-03-14 LAB — CBC WITH DIFFERENTIAL/PLATELET
Abs Immature Granulocytes: 0.05 10*3/uL (ref 0.00–0.07)
Basophils Absolute: 0 10*3/uL (ref 0.0–0.1)
Basophils Relative: 0 %
Eosinophils Absolute: 0.1 10*3/uL (ref 0.0–0.5)
Eosinophils Relative: 0 %
HCT: 41.2 % (ref 39.0–52.0)
Hemoglobin: 13.9 g/dL (ref 13.0–17.0)
Immature Granulocytes: 0 %
Lymphocytes Relative: 15 %
Lymphs Abs: 2.1 10*3/uL (ref 0.7–4.0)
MCH: 28.1 pg (ref 26.0–34.0)
MCHC: 33.7 g/dL (ref 30.0–36.0)
MCV: 83.2 fL (ref 80.0–100.0)
Monocytes Absolute: 1.5 10*3/uL — ABNORMAL HIGH (ref 0.1–1.0)
Monocytes Relative: 10 %
Neutro Abs: 10.6 10*3/uL — ABNORMAL HIGH (ref 1.7–7.7)
Neutrophils Relative %: 75 %
Platelets: 351 10*3/uL (ref 150–400)
RBC: 4.95 MIL/uL (ref 4.22–5.81)
RDW: 17 % — ABNORMAL HIGH (ref 11.5–15.5)
WBC: 14.4 10*3/uL — ABNORMAL HIGH (ref 4.0–10.5)
nRBC: 0 % (ref 0.0–0.2)

## 2023-03-14 LAB — ETHANOL: Alcohol, Ethyl (B): <10 mg/dL (ref <10)

## 2023-03-14 MED ORDER — SODIUM CHLORIDE 0.9 % IV BOLUS
1000.0000 mL | Freq: Once | INTRAVENOUS | Status: AC
  Administered 2023-03-14: 1000 mL via INTRAVENOUS

## 2023-03-14 MED ORDER — NALOXONE HCL 0.4 MG/ML IJ SOLN
0.4000 mg | Freq: Once | INTRAMUSCULAR | Status: AC
  Administered 2023-03-14: 0.4 mg via INTRAVENOUS
  Filled 2023-03-14: qty 1

## 2023-03-14 MED ORDER — ONDANSETRON HCL 4 MG/2ML IJ SOLN
4.0000 mg | Freq: Once | INTRAMUSCULAR | Status: AC
  Administered 2023-03-14: 4 mg via INTRAVENOUS
  Filled 2023-03-14: qty 2

## 2023-03-14 NOTE — ED Notes (Signed)
Immediate improvement in cognition, and respiratory status post narcan administration. Pt now, awake, talkative, and breathing without assistance.

## 2023-03-14 NOTE — ED Triage Notes (Signed)
Arrives [facility] from home after suspected overdose from opiates/ amphetamines.   Denies all accusations on arrival, Incoherent.

## 2023-03-14 NOTE — ED Provider Notes (Signed)
[place] EMERGENCY DEPARTMENT AT [facility] Provider Note  CSN: [id number] Arrival date & time: 03/14/23 2040  Chief Complaint(s) No chief complaint on file.  HPI Brady Nunez is a 32 y.o. male with past medical history as below, significant for sickle cell trait who presents to the ED with complaint of possible overdose  Patient arrives by EMS secondary to possible overdose.  EMS reports the patient initially told EMS that he ingested amphetamines.  Patient later denies this.  He denies any possible ingestion.  Denies SI or HI.  He denies any acute complaints at all whatsoever.  No chest pain or dyspnea.  No nausea or vomiting.  No hallucinations or delusions.  He reports that any possible ingestion would be purely unintentional and he has no desire to harm self and adamantly denies any sort of possible illicit drug ingestion today  Past Medical History Past Medical History:  Diagnosis Date   Renal disorder    kidney stone   Sickle cell trait (HCC)    There are no problems to display for this patient.  Home Medication(s) Prior to Admission medications   Medication Sig Start Date End Date Taking? Authorizing Provider  benzonatate (TESSALON) 100 MG capsule Take 2 capsules (200 mg total) by mouth 3 (three) times daily as needed. 03/16/16   Burgess Amor, PA-C  diphenhydrAMINE (BENADRYL) 25 MG tablet Take 2 tablets (50 mg total) by mouth every 6 (six) hours as needed. 05/13/15   Ward, Layla Maw, DO  famotidine (PEPCID) 20 MG tablet Take 1 tablet (20 mg total) by mouth 2 (two) times daily. Patient not taking: Reported on 07/25/2015 05/13/15   Ward, Layla Maw, DO  predniSONE (DELTASONE) 20 MG tablet Take 2 tablets (40 mg total) by mouth daily. 03/03/23   Pauline Aus, PA-C                                                                                                                                    Past Surgical History History reviewed. No pertinent surgical  history. Family History History reviewed. No pertinent family history.  Social History Social History   Tobacco Use   Smoking status: Every Day    Current packs/day: 1.00    Types: Cigarettes   Smokeless tobacco: Current  Substance Use Topics   Alcohol use: Yes    Comment: occ   Drug use: Yes    Types: Methamphetamines, Cocaine    Comment: pills, fentanyl   Allergies Tramadol  Review of Systems Review of Systems  Constitutional:  Negative for chills and fever.  Respiratory:  Negative for chest tightness and shortness of breath.   Cardiovascular:  Negative for chest pain and palpitations.  Gastrointestinal:  Negative for abdominal pain, nausea and vomiting.  Genitourinary:  Negative for dysuria and urgency.  Musculoskeletal:  Negative for back pain.  Neurological:  Negative for tremors, seizures and headaches.    Physical Exam  Vital Signs  I have reviewed the triage vital signs BP (!) 133/99   Pulse (!) 145   Temp 98.9 F (37.2 C)   Resp 16   Ht 5\' 7"  (1.702 m)   Wt 63.5 kg   SpO2 98%   BMI 21.93 kg/m  Physical Exam Vitals and nursing note reviewed.  Constitutional:      General: He is not in acute distress.    Appearance: He is well-developed.  HENT:     Head: Normocephalic and atraumatic.     Right Ear: External ear normal.     Left Ear: External ear normal.     Mouth/Throat:     Mouth: Mucous membranes are dry.  Eyes:     General: No scleral icterus.    Comments: Pupils constricted/reactive b/l  Cardiovascular:     Rate and Rhythm: Regular rhythm. Tachycardia present.     Pulses: Normal pulses.     Heart sounds: Normal heart sounds.  Pulmonary:     Effort: Pulmonary effort is normal. No respiratory distress.     Breath sounds: Normal breath sounds.  Abdominal:     General: Abdomen is flat.     Palpations: Abdomen is soft.     Tenderness: There is no abdominal tenderness. There is no guarding.  Musculoskeletal:     Cervical back: No rigidity.      Right lower leg: No edema.     Left lower leg: No edema.  Skin:    General: Skin is warm and dry.     Capillary Refill: Capillary refill takes less than 2 seconds.  Neurological:     Mental Status: He is alert and oriented to person, place, and time.     GCS: GCS eye subscore is 4. GCS verbal subscore is 5. GCS motor subscore is 6.     Cranial Nerves: No dysarthria or facial asymmetry.     Motor: No tremor or seizure activity.  Psychiatric:        Mood and Affect: Mood normal.        Behavior: Behavior normal.        Thought Content: Thought content does not include homicidal or suicidal ideation. Thought content does not include homicidal or suicidal plan.     ED Results and Treatments Labs (all labs ordered are listed, but only abnormal results are displayed) Labs Reviewed  CBC WITH DIFFERENTIAL/PLATELET - Abnormal; Notable for the following components:      Result Value   WBC 14.4 (*)    RDW 17.0 (*)    Neutro Abs 10.6 (*)    Monocytes Absolute 1.5 (*)    All other components within normal limits  COMPREHENSIVE METABOLIC PANEL - Abnormal; Notable for the following components:   Glucose, Bld 131 (*)    Creatinine, Ser 1.29 (*)    All other components within normal limits  ETHANOL  RAPID URINE DRUG SCREEN, HOSP PERFORMED  Radiology No results found.  Pertinent labs & imaging results that were available during my care of the patient were reviewed by me and considered in my medical decision making (see MDM for details).  Medications Ordered in ED Medications  sodium chloride 0.9 % bolus 1,000 mL (0 mLs Intravenous Stopped 03/14/23 2112)  naloxone [facility]) injection 0.4 mg (0.4 mg Intravenous Given 03/14/23 2102)  ondansetron (ZOFRAN) injection 4 mg (4 mg Intravenous Given 03/14/23 2059)                                                                                                                                      Procedures Procedures  (including critical care time)  Medical Decision Making / ED Course    Medical Decision Making:    Brady Nunez is a 32 y.o. male with past medical history as below, significant for sickle cell trait who presents to the ED with complaint of possible overdose. The complaint involves an extensive differential diagnosis and also carries with it a high risk of complications and morbidity.  Serious etiology was considered. Ddx includes but is not limited to: Illicit substance ingestion, metabolic derangement, infectious, dehydration, etc.  Complete initial physical exam performed, notably the patient  was tachycardia noted.  Blood pressure stable.  Patient intermittently falling asleep during examination..    Reviewed and confirmed nursing documentation for past medical history, family history, social history.  Vital signs reviewed.    Clinical Course as of 03/14/23 1610  Wynelle Link Mar 14, 2023  2055 Patient currently falling asleep during examination, intermittently hypoxic.  Awakens easily to verbal stimulus.  Pupils are constricted.  Will give Narcan and monitor for response. [SG]  2345 Creatinine(!): 1.29 Elev, no baseline, given IVF [SG]    Clinical Course User Index [SG] Sloan Leiter, DO     Patient received Narcan, following this reports complete resolution of his symptoms, no longer requiring nasal cannula.  Heart rate remains elevated.  Given sig response to narcan concern for opiate misuse, pt initially also reported use of illicit stimulants to EMS but denies this on exam.   While pending remainder of workup patient was seen eloping from the ER.                 Additional history obtained: -Additional history obtained from EMS -External records from outside source obtained and reviewed including: Chart review including previous notes, labs, imaging, consultation notes  including  Prior ED visits, home medications   Lab Tests: -I ordered, reviewed, and interpreted labs.   The pertinent results include:   Labs Reviewed  CBC WITH DIFFERENTIAL/PLATELET - Abnormal; Notable for the following components:      Result Value   WBC 14.4 (*)    RDW 17.0 (*)    Neutro Abs 10.6 (*)    Monocytes Absolute 1.5 (*)    All other components within normal limits  COMPREHENSIVE METABOLIC  PANEL - Abnormal; Notable for the following components:   Glucose, Bld 131 (*)    Creatinine, Ser 1.29 (*)    All other components within normal limits  ETHANOL  RAPID URINE DRUG SCREEN, HOSP PERFORMED    Notable for Cr+ no baseline, wbc + no baseline   EKG   EKG Interpretation Date/Time:  Sunday March 14 2023 20:52:50 EDT Ventricular Rate:  131 PR Interval:  151 QRS Duration:  72 QT Interval:  282 QTC Calculation: 417 R Axis:   80  Text Interpretation: Sinus tachycardia LAE, consider biatrial enlargement Confirmed by Tanda Rockers ([id number]) on 03/14/2023 8:59:23 PM         Imaging Studies ordered: na   Medicines ordered and prescription drug management: Meds ordered this encounter  Medications   sodium chloride 0.9 % bolus 1,000 mL   naloxone (NARCAN) injection 0.4 mg   ondansetron (ZOFRAN) injection 4 mg    -I have reviewed the patients home medicines and have made adjustments as needed   Consultations Obtained: na   Cardiac Monitoring: The patient was maintained on a cardiac monitor.  I personally viewed and interpreted the cardiac monitored which showed an underlying rhythm of: sinus tachy  Social Determinants of Health:  Diagnosis or treatment significantly limited by social determinants of health: current smoker Counseled patient for approximately 3 minutes regarding smoking cessation. Discussed risks of smoking and how they applied and affected their visit here today. Patient not ready to quit at this time, however will follow up with their primary  doctor when they are.   CPT code: 96045: intermediate counseling for smoking cessation     Reevaluation: After the interventions noted above, I reevaluated the patient and found that they have improved  Co morbidities that complicate the patient evaluation  Past Medical History:  Diagnosis Date   Renal disorder    kidney stone   Sickle cell trait (HCC)       Dispostion: Disposition decision including need for hospitalization was considered, and patient Eloped    Final Clinical Impression(s) / ED Diagnoses Final diagnoses:  Opiate misuse        Sloan Leiter, DO 03/14/23 2348

## 2023-03-14 NOTE — ED Notes (Addendum)
Post narcan administration pt says I'm much better and no longer want to be here. Refuses treatment and says take this IV out or I will.   Pt now alert, oriented, and ambulatory. Encouraged to remain in ED r/t life threatening complications of drug use and medication administration. Pt denies and says I have seen this before I know when I'm okay.   Ambulatory on departure.

## 2023-03-19 ENCOUNTER — Encounter (HOSPITAL_COMMUNITY): Payer: MEDICAID

## 2023-04-07 ENCOUNTER — Ambulatory Visit (HOSPITAL_COMMUNITY): Payer: MEDICAID | Attending: Trauma Surgery

## 2023-04-07 DIAGNOSIS — M79605 Pain in left leg: Secondary | ICD-10-CM | POA: Insufficient documentation

## 2023-04-07 DIAGNOSIS — R262 Difficulty in walking, not elsewhere classified: Secondary | ICD-10-CM | POA: Diagnosis present

## 2023-04-07 DIAGNOSIS — Z8781 Personal history of (healed) traumatic fracture: Secondary | ICD-10-CM | POA: Insufficient documentation

## 2023-04-07 NOTE — Therapy (Signed)
OUTPATIENT PHYSICAL THERAPY TREATMENT (LOWER EXTREMITY)   Patient Name: Brady Nunez MRN: [id number] DOB:1991-06-11, 32 y.o., male Today's Date: 04/07/2023   PHYSICAL THERAPY DISCHARGE SUMMARY  Visits from Start of Care: 6  Current functional level related to goals / functional outcomes: See below    Remaining deficits: See below   Education / Equipment: See below    Patient agrees to discharge. Patient goals were met. Patient is being discharged due to meeting the stated rehab goals.   END OF SESSION:   PT End of Session - 04/07/23 1345     Visit Number 6    Number of Visits 8    Date for PT Re-Evaluation 04/16/23    Authorization Type [employer](limit- no; auth- no)    PT Start Time 0145    PT Stop Time 0225    PT Time Calculation (min) 40 min    Activity Tolerance Patient tolerated treatment well    Behavior During Therapy WFL for tasks assessed/performed               Past Medical History:  Diagnosis Date   Renal disorder    kidney stone   Sickle cell trait (HCC)    No past surgical history on file. There are no problems to display for this patient.   PCP: Pcp, NoPCP - General   REFERRING PROVIDER: Ernst Bowler, MDRef Provider   REFERRING DIAG: [phone] (ICD-10-CM) - Displaced bicondylar fracture of left tibia, initial encounter for closed fracture   Rationale for Evaluation and Treatment: Rehabilitation  THERAPY DIAG:  No diagnosis found.  ONSET DATE: Oct 31, 2022 --------------------------------------------------------------------------------------------- SUBJECTIVE:                                                                                                                                                                                           SUBJECTIVE STATEMENT: Today:  Patient saw his MD on 03/09/13; MD performed XR which showed WFL healing.   IE: Patient was doing a backflip and fractured left tibia on 10/31/22. Patient was  in prison for two weeks. Surgery was done on May 15th, closed reduction. Patient was released from prison. Patient was put into brace; wore brace from June 2024 until Aug 1st, 2024 where MD cleared patient; referred to OPPT  PERTINENT HISTORY:  N/a  PAIN:  Are you having pain? Yes: NPRS scale: 3-4/10 Pain location: left lef, tibia Pain description: dull, numb Aggravating factors: any movements Relieving factors: rest, pain medicine   PRECAUTIONS: None  RED FLAGS: None   WEIGHT BEARING RESTRICTIONS: No  FALLS:  Has patient fallen in last 6 months? 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 patient does not feel he can  PLOF: Independent  PATIENT GOALS: To be able to walk pain free  NEXT MD VISIT: TBD --------------------------------------------------------------------------------------------- OBJECTIVE:   DIAGNOSTIC FINDINGS:  Patient was seen in [place]- no imaging on mychart  PATIENT SURVEYS:  LEFS Lower Extremity Functional Score: 23 / 80 = 28.7 %  04/07/23 Lower Extremity Functional Score: 68 / 80 = 85.0 %  SCREENING FOR RED FLAGS: Bowel or bladder incontinence: No Spinal tumors: No Cauda equina syndrome: No Compression fracture: No Abdominal aneurysm: No  COGNITION: Overall cognitive status: Within functional limits for tasks assessed  POSTURE: No Significant postural limitations      FUNCTIONAL TESTS:  2 minute walk test: 264ft pt using  single point cane on left UE; moderate antalgia/pain   : 421 feet with no AD  GAIT ANALYSIS: Distance walked: 65ft Assistive device utilized: Single point cane Level of assistance: Complete Independence Comments: Patient ambulates into clinic with single point cane left UE; MOD antalgia, increased knee flexion in left knee during  stance  SENSATION: Light touch: Impaired along L5 dermatome    LOWER EXTREMITY MMT:     Lower extremity grossly 4/5 on left except; left ankle Eversion/dorsiflexion 3+/5 LOWER EXTREMITY ROM:     Left lower extremity grossly WFL ANKLE/HIP SPECIAL TESTS Not indicated due to post-op status  PALPATION: MOD tenderness to palpation along incision on left tibia  INTEGUMENTARY   Incision C/D/I with no signs of infection  --------------------------------------------------------------------------------------------- TODAY'S TREATMENT:                                                                                                                              DATE:   04/07/23  PT discharge- objective measures    03/12/23 Therapeutic exercise x 30' -Bike L3 -Seated leg curls 60# 2x10 -Seated leg press DL, single leg 47# [id number] -calf raises / stretch on slant board Gait Training x10' -forward/Retro TM walking/ incline   03/05/23 Neuromuscular re-education x32' -Lunges on 4" step + weighted ball hold in // bars 2x10 -Partial lunges with knee to 4" step in // bars 2x10 -Dynamic lunges with (2)10# dumbbells 2x10 -Single leg hip hinges +foam roller 2x10 -single leg stance on foam pad + ball toss on Rebounder Manual therapy x 8' -instrument-assisted soft tissue mobilization  to both scars/ incision sites for re sensitization abduction scar mobility      PATIENT EDUCATION:  Education details: assistive device use, pain management, HEP Person educated: Patient Education method: Medical illustrator Education comprehension: verbalized understanding  HOME EXERCISE PROGRAM: Access Code: [email or web address] URL: [email or web address] Date: 02/26/2023 Prepared by: Seymour Bars  Exercises - Straight Leg Raise  - 1 x daily - 20 reps - Long Sitting Ankle PNF D2 Plantarflexion with Resistance  -  1 x daily - 20 reps - Long Sitting Ankle PNF D2 Dorsiflexion with Resistance  - 1  x daily - 20 reps - Heel Toe Raises with Counter Support  - 20 reps - Squat with Chair Touch  - 20 reps - Forward Step Down  - 1 x daily - 20 reps --------------------------------------------------------------------------------------------- ASSESSMENT:  CLINICAL IMPRESSION: PT to perform tests/measures for patient discharge, patient completed all stated goals by the anticipated date of 04/16/23  Eval:  Patient is a 32 y.o. y.o. male who was seen today for physical therapy evaluation and treatment for left leg pain, difficulty walking post/op closed tibia fx 10/31/22. Patient presents to PT with the following objective impairments: Abnormal gait, difficulty walking, decreased ROM, decreased strength, and pain. These impairments limit the patient in activities such as carrying, lifting, bending, sitting, standing, squatting, stairs, and locomotion level. These impairments also limit the patient in participation such as meal prep, cleaning, driving, occupation, and yard work. The patient will benefit from PT to address the limitations/impairments listed below to return to their prior level of function in the domains of activity and participation.    PERSONAL FACTORS:  n/a  are also affecting patient's functional outcome.   REHAB POTENTIAL: Excellent  CLINICAL DECISION MAKING: Stable/uncomplicated  EVALUATION COMPLEXITY: Low  --------------------------------------------------------------------------------------------- GOALS: Goals reviewed with patient? Yes  SHORT TERM GOALS: Target date: 03/19/2023    Patient will be able to complete 300 ft on the Test to improve lower extremity strength to facilitate safe ambulation around home and community  Baseline: Goal status: goal met   2.  Patient will score a >/=35 on the  LEFS  to demonstrate an improvement in ADL completion, stair negotiation, household/community ambulation, and self-care Baseline:  Goal status: goal met   3. Patient  will be independent with a basic stretching/strengthening HEP  Baseline:  Goal status: goal met    LONG TERM GOALS: Target date: 04/16/2023    Patient will be able to complete 400 ft on the Test to improve lower extremity strength to facilitate safe ambulation around Baseline:  Goal status: goal met   2.  Patient will score a >/=50 on the  LEFS  to demonstrate an improvement in ADL completion, stair negotiation, household/community ambulation, and self-care Baseline:  Goal status: goal met   3.  Patient will be independent with a comprehensive strengthening HEP  Baseline:  Goal status: goal met   --------------------------------------------------------------------------------------------- PLAN:  PT FREQUENCY: 2x/week  PT DURATION: 6 weeks  PLANNED INTERVENTIONS: Therapeutic exercises, Therapeutic activity, Neuromuscular re-education, Balance training, Gait training, Patient/Family education, Self Care, and Joint mobilization.  PLAN FOR NEXT SESSION: Discharge  Seymour Bars, PT 04/07/2023, 1:46 PM
